# Patient Record
Sex: Male | Born: 1965
Health system: Southern US, Community
[De-identification: ages and names within clinical notes are randomized; demographics above are authoritative.]

## PROBLEM LIST (undated history)

## (undated) ENCOUNTER — Ambulatory Visit: Admission: EM | Payer: BC Managed Care – PPO | Source: Home / Self Care

## (undated) ENCOUNTER — Ambulatory Visit: Admission: EM | Payer: BC Managed Care – PPO

## (undated) DIAGNOSIS — J45909 Unspecified asthma, uncomplicated: Secondary | ICD-10-CM

## (undated) DIAGNOSIS — W3400XA Accidental discharge from unspecified firearms or gun, initial encounter: Secondary | ICD-10-CM

## (undated) DIAGNOSIS — E785 Hyperlipidemia, unspecified: Secondary | ICD-10-CM

## (undated) DIAGNOSIS — I10 Essential (primary) hypertension: Secondary | ICD-10-CM

## (undated) DIAGNOSIS — Z720 Tobacco use: Secondary | ICD-10-CM

## (undated) DIAGNOSIS — F1911 Other psychoactive substance abuse, in remission: Secondary | ICD-10-CM

## (undated) HISTORY — DX: Tobacco use: Z72.0

## (undated) HISTORY — DX: Unspecified asthma, uncomplicated: J45.909

## (undated) HISTORY — DX: Other psychoactive substance abuse, in remission: F19.11

## (undated) HISTORY — DX: Hyperlipidemia, unspecified: E78.5

## (undated) HISTORY — PX: ORCHIECTOMY: SHX2116

---

## 2000-03-13 ENCOUNTER — Encounter: Payer: Self-pay | Admitting: Emergency Medicine

## 2000-03-13 ENCOUNTER — Inpatient Hospital Stay (HOSPITAL_COMMUNITY): Admission: EM | Admit: 2000-03-13 | Discharge: 2000-03-15 | Payer: Self-pay

## 2000-03-16 ENCOUNTER — Emergency Department (HOSPITAL_COMMUNITY): Admission: EM | Admit: 2000-03-16 | Discharge: 2000-03-16 | Payer: Self-pay | Admitting: Emergency Medicine

## 2001-04-16 ENCOUNTER — Emergency Department (HOSPITAL_COMMUNITY): Admission: EM | Admit: 2001-04-16 | Discharge: 2001-04-16 | Payer: Self-pay | Admitting: Emergency Medicine

## 2001-04-16 ENCOUNTER — Encounter: Payer: Self-pay | Admitting: Emergency Medicine

## 2013-07-06 ENCOUNTER — Encounter (HOSPITAL_COMMUNITY): Payer: Self-pay | Admitting: Emergency Medicine

## 2013-07-06 ENCOUNTER — Emergency Department (HOSPITAL_COMMUNITY)
Admission: EM | Admit: 2013-07-06 | Discharge: 2013-07-06 | Disposition: A | Discharge status: Home / Self Care | Payer: Self-pay | Attending: Emergency Medicine | Admitting: Emergency Medicine

## 2013-07-06 DIAGNOSIS — K648 Other hemorrhoids: Secondary | ICD-10-CM | POA: Insufficient documentation

## 2013-07-06 DIAGNOSIS — Z79899 Other long term (current) drug therapy: Secondary | ICD-10-CM | POA: Insufficient documentation

## 2013-07-06 DIAGNOSIS — I10 Essential (primary) hypertension: Secondary | ICD-10-CM | POA: Insufficient documentation

## 2013-07-06 DIAGNOSIS — K921 Melena: Secondary | ICD-10-CM

## 2013-07-06 DIAGNOSIS — N400 Enlarged prostate without lower urinary tract symptoms: Secondary | ICD-10-CM | POA: Insufficient documentation

## 2013-07-06 DIAGNOSIS — K625 Hemorrhage of anus and rectum: Secondary | ICD-10-CM | POA: Insufficient documentation

## 2013-07-06 DIAGNOSIS — R319 Hematuria, unspecified: Secondary | ICD-10-CM

## 2013-07-06 DIAGNOSIS — K644 Residual hemorrhoidal skin tags: Secondary | ICD-10-CM | POA: Insufficient documentation

## 2013-07-06 DIAGNOSIS — F17200 Nicotine dependence, unspecified, uncomplicated: Secondary | ICD-10-CM | POA: Insufficient documentation

## 2013-07-06 HISTORY — DX: Essential (primary) hypertension: I10

## 2013-07-06 LAB — URINE MICROSCOPIC-ADD ON

## 2013-07-06 LAB — CBC WITH DIFFERENTIAL/PLATELET
Basophils Relative: 0 % (ref 0–1)
Eosinophils Absolute: 0.1 10*3/uL (ref 0.0–0.7)
Hemoglobin: 16.6 g/dL (ref 13.0–17.0)
Lymphocytes Relative: 33 % (ref 12–46)
Lymphs Abs: 1.7 10*3/uL (ref 0.7–4.0)
MCH: 31.7 pg (ref 26.0–34.0)
MCHC: 35.8 g/dL (ref 30.0–36.0)
MCV: 88.5 fL (ref 78.0–100.0)
Monocytes Relative: 11 % (ref 3–12)
Neutro Abs: 2.7 10*3/uL (ref 1.7–7.7)
Neutrophils Relative %: 53 % (ref 43–77)
Platelets: 304 10*3/uL (ref 150–400)
RBC: 5.24 MIL/uL (ref 4.22–5.81)
RDW: 14 % (ref 11.5–15.5)
WBC: 5 10*3/uL (ref 4.0–10.5)

## 2013-07-06 LAB — URINALYSIS, ROUTINE W REFLEX MICROSCOPIC
Glucose, UA: NEGATIVE mg/dL
Hgb urine dipstick: NEGATIVE
Ketones, ur: 40 mg/dL — AB
Nitrite: NEGATIVE
Specific Gravity, Urine: 1.027 (ref 1.005–1.030)
Urobilinogen, UA: 1 mg/dL (ref 0.0–1.0)
pH: 6 (ref 5.0–8.0)

## 2013-07-06 LAB — BASIC METABOLIC PANEL
BUN: 10 mg/dL (ref 6–23)
CO2: 26 mEq/L (ref 19–32)
Calcium: 9.5 mg/dL (ref 8.4–10.5)
Chloride: 101 mEq/L (ref 96–112)
Creatinine, Ser: 0.8 mg/dL (ref 0.50–1.35)
GFR calc Af Amer: >90 mL/min (ref >90)
GFR calc non Af Amer: >90 mL/min (ref >90)
Glucose, Bld: 102 mg/dL — ABNORMAL HIGH (ref 70–99)
Potassium: 4 mEq/L (ref 3.5–5.1)
Sodium: 138 mEq/L (ref 135–145)

## 2013-07-06 LAB — OCCULT BLOOD, POC DEVICE: Fecal Occult Bld: POSITIVE — AB

## 2013-07-06 MED ORDER — DOCUSATE SODIUM 100 MG PO CAPS: 100.0000 mg | ORAL_CAPSULE | Freq: Two times a day (BID) | ORAL | Status: DC

## 2013-07-06 NOTE — ED Notes (Signed)
Pt reports that he went for a drug screen for his job a month ago and was told he had blood in his urine. After that pt reports he started noticing bright red blood in his stools. Pt reports it has happened 15 times in the past month. Denies painful urination or BM's. Denies h/o hemorrhoids. Pt not on any blood thinners.

## 2013-07-06 NOTE — ED Notes (Signed)
MD at bedside. 

## 2013-07-06 NOTE — ED Provider Notes (Signed)
CSN: [id number]     Arrival date & time 07/06/13  4098 History   First MD Initiated Contact with Patient 07/06/13 [phone]     Chief Complaint  Patient presents with  . Hematuria  . Rectal Bleeding   (Consider location/radiation/quality/duration/timing/severity/associated sxs/prior Treatment) HPI Comments: 47 yo male presents to "get checked out" for cc of blood in urine which was found on UA at work.  Pt is without symptoms to include abd pain, f/c, n/v/d, or constipation, f/u/d.  He also reports occasional blood on toilet paper when wiping after a BM.  No blood in stool or commode.  He denies h/o hemorrhoids.  He denies CP, sob, lightheadedness.  Pt is pain free currently and denies pain associated with symptoms.    A: nkda M: none (noncompliant on BP) PMH: HTN PSH: none SHx: pos smoking, EtOH (3 drinks/day), no IDU  Patient is a 47 y.o. male presenting with hematuria and hematochezia. The history is provided by the patient. No language interpreter was used.  Hematuria This is a new problem. The current episode started more than 1 week ago. The problem occurs constantly. The problem has not changed since onset.Pertinent negatives include no chest pain, no abdominal pain, no headaches and no shortness of breath. Associated symptoms comments: No f/u/d, no abd pain, no n/v/d. He has tried nothing for the symptoms.  Rectal Bleeding Quality:  Bright red Amount:  Scant Duration:  1 month Timing:  Intermittent Chronicity:  New Context: not constipation, not diarrhea, not foreign body, not rectal injury and not rectal pain   Similar prior episodes: no   Relieved by:  None tried Associated symptoms: no abdominal pain, no dizziness, no epistaxis, no hematemesis, no light-headedness and no vomiting   Risk factors: no anticoagulant use, no hx of colorectal cancer, no hx of colorectal surgery, no hx of IBD, no liver disease, no NSAID use and no steroid use     Past Medical History  Diagnosis  Date  . Hypertension    History reviewed. No pertinent past surgical history. Family History  Problem Relation Age of Onset  . Diabetes Mother   . Hypertension Mother   . Diabetes Father    History  Substance Use Topics  . Smoking status: Current Every Day Smoker    Types: Cigars  . Smokeless tobacco: Never Used  . Alcohol Use: Yes     Comment: twice a week    Review of Systems  Constitutional: Negative.   HENT: Negative.  Negative for congestion, dental problem, drooling and nosebleeds.   Eyes: Negative.   Respiratory: Negative.  Negative for shortness of breath.   Cardiovascular: Negative for chest pain.  Gastrointestinal: Positive for blood in stool and hematochezia. Negative for nausea, vomiting, abdominal pain, diarrhea, constipation, abdominal distention, anal bleeding, rectal pain and hematemesis.  Endocrine: Negative.   Genitourinary: Positive for hematuria. Negative for dysuria, urgency, frequency, flank pain, decreased urine volume, discharge, penile swelling, scrotal swelling, enuresis, difficulty urinating, genital sores, penile pain and testicular pain.  Musculoskeletal: Negative.   Skin: Negative.   Neurological: Negative.  Negative for dizziness, light-headedness and headaches.  Hematological: Negative.     Allergies  Review of patient's allergies indicates no known allergies.  Home Medications   Current Outpatient Rx  Name  Route  Sig  Dispense  Refill  . docusate sodium (COLACE) 100 MG capsule   Oral   Take 1 capsule (100 mg total) by mouth every 12 (twelve) hours.   60 capsule  0    BP 148/90  Pulse 72  Temp(Src) 98 F (36.7 C) (Oral)  Resp 18  Ht 5\' 6"  (1.676 m)  Wt 150 lb (68.04 kg)  BMI 24.22 kg/m2  SpO2 98% Physical Exam  Constitutional: He is oriented to person, place, and time. He appears well-developed and well-nourished.  HENT:  Head: Normocephalic and atraumatic.  Eyes: Conjunctivae are normal. Right eye exhibits no discharge.  Left eye exhibits no discharge.  Neck: Normal range of motion. Neck supple.  Cardiovascular: Normal rate and regular rhythm.   Pulmonary/Chest: Effort normal and breath sounds normal. No respiratory distress. He has no wheezes. He has no rales.  Abdominal: Soft. Bowel sounds are normal. He exhibits no distension and no mass. There is no tenderness. There is no rebound and no guarding. Hernia confirmed negative in the left inguinal area.  Genitourinary: Rectal exam shows external hemorrhoid and internal hemorrhoid. Rectal exam shows no fissure, no mass, no tenderness and anal tone normal. Guaiac positive stool.    Prostate is enlarged. Prostate is not tender. Left testis shows no mass, no swelling and no tenderness. Left testis is descended. Cremasteric reflex is not absent on the left side. Circumcised. No phimosis, paraphimosis, hypospadias, penile erythema or penile tenderness. No discharge found.  No gross blood on DRE  Musculoskeletal: He exhibits no edema and no tenderness.  Neurological: He is alert and oriented to person, place, and time. He has normal reflexes.  Skin: Skin is warm and dry.    ED Course  Procedures (including critical care time) Labs Review Labs Reviewed  BASIC METABOLIC PANEL - Abnormal; Notable for the following:    Glucose, Bld 102 (*)    All other components within normal limits  URINALYSIS, ROUTINE W REFLEX MICROSCOPIC - Abnormal; Notable for the following:    Color, Urine AMBER (*)    Bilirubin Urine SMALL (*)    Ketones, ur 40 (*)    Leukocytes, UA TRACE (*)    All other components within normal limits  OCCULT BLOOD, POC DEVICE - Abnormal; Notable for the following:    Fecal Occult Bld POSITIVE (*)    All other components within normal limits  CBC WITH DIFFERENTIAL  URINE MICROSCOPIC-ADD ON   Imaging Review No results found.  EKG Interpretation   None      Results for orders placed during the hospital encounter of 07/06/13  CBC WITH  DIFFERENTIAL      Result Value Range   WBC 5.0  4.0 - 10.5 K/uL   RBC 5.24  4.22 - 5.81 MIL/uL   Hemoglobin 16.6  13.0 - 17.0 g/dL   HCT 65.7  84.6 - 96.2 %   MCV 88.5  78.0 - 100.0 fL   MCH 31.7  26.0 - 34.0 pg   MCHC 35.8  30.0 - 36.0 g/dL   RDW 95.2  84.1 - 32.4 %   Platelets 304  150 - 400 K/uL   Neutrophils Relative % 53  43 - 77 %   Neutro Abs 2.7  1.7 - 7.7 K/uL   Lymphocytes Relative 33  12 - 46 %   Lymphs Abs 1.7  0.7 - 4.0 K/uL   Monocytes Relative 11  3 - 12 %   Monocytes Absolute 0.5  0.1 - 1.0 K/uL   Eosinophils Relative 2  0 - 5 %   Eosinophils Absolute 0.1  0.0 - 0.7 K/uL   Basophils Relative 0  0 - 1 %   Basophils Absolute 0.0  0.0 - 0.1 K/uL  BASIC METABOLIC PANEL      Result Value Range   Sodium 138  135 - 145 mEq/L   Potassium 4.0  3.5 - 5.1 mEq/L   Chloride 101  96 - 112 mEq/L   CO2 26  19 - 32 mEq/L   Glucose, Bld 102 (*) 70 - 99 mg/dL   BUN 10  6 - 23 mg/dL   Creatinine, Ser 6.57  0.50 - 1.35 mg/dL   Calcium 9.5  8.4 - 84.6 mg/dL   GFR calc non Af Amer >90  >90 mL/min   GFR calc Af Amer >90  >90 mL/min  URINALYSIS, ROUTINE W REFLEX MICROSCOPIC      Result Value Range   Color, Urine AMBER (*) YELLOW   APPearance CLEAR  CLEAR   Specific Gravity, Urine 1.027  1.005 - 1.030   pH 6.0  5.0 - 8.0   Glucose, UA NEGATIVE  NEGATIVE mg/dL   Hgb urine dipstick NEGATIVE  NEGATIVE   Bilirubin Urine SMALL (*) NEGATIVE   Ketones, ur 40 (*) NEGATIVE mg/dL   Protein, ur NEGATIVE  NEGATIVE mg/dL   Urobilinogen, UA 1.0  0.0 - 1.0 mg/dL   Nitrite NEGATIVE  NEGATIVE   Leukocytes, UA TRACE (*) NEGATIVE  URINE MICROSCOPIC-ADD ON      Result Value Range   Squamous Epithelial / LPF RARE  RARE   WBC, UA 3-6  <3 WBC/hpf   RBC / HPF 3-6  <3 RBC/hpf   Bacteria, UA RARE  RARE  OCCULT BLOOD, POC DEVICE      Result Value Range   Fecal Occult Bld POSITIVE (*) NEGATIVE    MDM   1. Hematuria   2. Blood in the stool   3.  Smoking Cessation Counseling.    47 year old  male presents emergency broach chief complaint of reported hematuria from a recent urinalysis and blood on toilet paper when wiping. Patient has no abdominal pain. His vital signs are stable. He has a benign abdominal and GU exam. No gross blood on rectal.  Labs negative.  No hematuria on UA.  H/H normal.    Pt is to follow up with PCP for outpatient evaluation.  He may benefit from a colonoscopy.  Smoking cessation counseling given. Pt agrees with plan.   The patient appears reasonably screened and/or stabilized for discharge and I doubt any other medical condition or other [facility] requiring further screening, evaluation, or treatment in the ED at this time prior to discharge.    Darlys Gales, MD 07/06/13 2000

## 2013-08-24 ENCOUNTER — Emergency Department (HOSPITAL_COMMUNITY): Payer: Self-pay

## 2013-08-24 ENCOUNTER — Emergency Department (HOSPITAL_COMMUNITY)
Admission: EM | Admit: 2013-08-24 | Discharge: 2013-08-24 | Disposition: A | Discharge status: Home / Self Care | Payer: Self-pay | Attending: Emergency Medicine | Admitting: Emergency Medicine

## 2013-08-24 ENCOUNTER — Encounter (HOSPITAL_COMMUNITY): Payer: Self-pay | Admitting: Emergency Medicine

## 2013-08-24 DIAGNOSIS — M25519 Pain in unspecified shoulder: Secondary | ICD-10-CM | POA: Insufficient documentation

## 2013-08-24 DIAGNOSIS — Y929 Unspecified place or not applicable: Secondary | ICD-10-CM | POA: Insufficient documentation

## 2013-08-24 DIAGNOSIS — Z79899 Other long term (current) drug therapy: Secondary | ICD-10-CM | POA: Insufficient documentation

## 2013-08-24 DIAGNOSIS — S161XXA Strain of muscle, fascia and tendon at neck level, initial encounter: Secondary | ICD-10-CM

## 2013-08-24 DIAGNOSIS — S139XXA Sprain of joints and ligaments of unspecified parts of neck, initial encounter: Secondary | ICD-10-CM | POA: Insufficient documentation

## 2013-08-24 DIAGNOSIS — I10 Essential (primary) hypertension: Secondary | ICD-10-CM | POA: Insufficient documentation

## 2013-08-24 DIAGNOSIS — Y990 Civilian activity done for income or pay: Secondary | ICD-10-CM | POA: Insufficient documentation

## 2013-08-24 DIAGNOSIS — X500XXA Overexertion from strenuous movement or load, initial encounter: Secondary | ICD-10-CM | POA: Insufficient documentation

## 2013-08-24 DIAGNOSIS — F17200 Nicotine dependence, unspecified, uncomplicated: Secondary | ICD-10-CM | POA: Insufficient documentation

## 2013-08-24 MED ORDER — NAPROXEN 500 MG PO TABS: 500.0000 mg | ORAL_TABLET | Freq: Two times a day (BID) | ORAL | Status: DC

## 2013-08-24 MED ORDER — METHOCARBAMOL 500 MG PO TABS: 500.0000 mg | ORAL_TABLET | Freq: Two times a day (BID) | ORAL | Status: DC

## 2013-08-24 NOTE — ED Notes (Signed)
Pt back from x-ray.

## 2013-08-24 NOTE — ED Provider Notes (Signed)
Medical screening examination/treatment/procedure(s) were performed by non-physician practitioner and as supervising physician I was immediately available for consultation/collaboration.  EKG Interpretation   None         Lavonta Tillis Y. Shane Melby, MD 08/24/13 1555 

## 2013-08-24 NOTE — ED Notes (Signed)
Pt. Stated, I was moving something on Friday night and I felt something pop in my neck and it goes down my shoulder.

## 2013-08-24 NOTE — ED Provider Notes (Signed)
CSN: [id number]     Arrival date & time 08/24/13  1131 History   This chart was scribed for non-physician practitioner Jaynie Crumble, PA-C, working with Gavin Pound. Oletta Lamas, MD, by Yevette Edwards, ED Scribe. This patient was seen in room TR10C/TR10C and the patient's care was started at 1:03 PM.  First MD Initiated Contact with Patient 08/24/13 1248     No chief complaint on file.   The history is provided by the patient. No language interpreter was used.   HPI Comments: Mark Mendez is a 47 y.o. male who presents to the Emergency Department complaining of neck pain which began three days ago when he was moving a couch up the stairs at work and heard a "pop."  He denies pain radiating down into his hand, though he experiences pain to his right shoulder when he lies down. The pt has treated his pain with tylenol. The pt denies a h/o neck pain. He is a daily smoker.   Past Medical History  Diagnosis Date  . Hypertension    History reviewed. No pertinent past surgical history. Family History  Problem Relation Age of Onset  . Diabetes Mother   . Hypertension Mother   . Diabetes Father    History  Substance Use Topics  . Smoking status: Current Every Day Smoker    Types: Cigars  . Smokeless tobacco: Never Used  . Alcohol Use: Yes     Comment: twice a week    Review of Systems  Musculoskeletal: Positive for neck pain.  All other systems reviewed and are negative.    Allergies  Review of patient's allergies indicates no known allergies.  Home Medications   Current Outpatient Rx  Name  Route  Sig  Dispense  Refill  . docusate sodium (COLACE) 100 MG capsule   Oral   Take 1 capsule (100 mg total) by mouth every 12 (twelve) hours.   60 capsule   0    Triage Vitals: BP 147/92  Pulse 67  Temp(Src) 98.6 F (37 C) (Oral)  Resp 19  Wt 160 lb (72.576 kg)  SpO2 100%  Physical Exam  Nursing note and vitals reviewed. Constitutional: He is oriented to person, place, and  time. He appears well-developed and well-nourished. No distress.  HENT:  Head: Normocephalic and atraumatic.  Eyes: EOM are normal.  Neck: Neck supple. No tracheal deviation present.  Cardiovascular: Normal rate.   Pulmonary/Chest: Effort normal. No respiratory distress.  Musculoskeletal: Normal range of motion. He exhibits tenderness.  No tenderness to midline cervical. Tender over the right trapezius.  Tender to sternocleidomastoid.  Pain with flexion to left of head.   Neurological: He is alert and oriented to person, place, and time.  Normal sensation. 5/5 equal strength and grip.   Skin: Skin is warm and dry.  Psychiatric: He has a normal mood and affect. His behavior is normal.    ED Course  Procedures (including critical care time)  DIAGNOSTIC STUDIES: Oxygen Saturation is 100% on room air, normal by my interpretation.    COORDINATION OF CARE:  1:08 PM- Discussed treatment plan with patient, and the patient agreed to the plan.   Labs Review Labs Reviewed - No data to display Imaging Review Dg Cervical Spine Complete  08/24/2013   CLINICAL DATA:  Pain.  EXAM: CERVICAL SPINE  4+ VIEWS  COMPARISON:  None.  FINDINGS: Diffuse mild degenerative change. No evidence of fracture. No dislocation. Lung apices are clear.  IMPRESSION: Diffuse mild degenerative change.  No acute abnormality   Electronically Signed   By: Maisie Fus  Register   On: 08/24/2013 12:21    EKG Interpretation   None       MDM   1. Cervical strain, initial encounter    Patient with neck pain after moving some furniture 3 days ago. He is neurovascularly intact. His x-rays showed diffuse degenerative changes. His pain is mainly in his right sternocleidomastoid and trapezius muscle. I suspect this is muscular strain. His strength is intact. He is able to move all extremities. Will start him on anti-inflammatories, muscle relaxant, followup with primary care Dr.  Ceasar Mons Vitals:   08/24/13 1137  BP: 147/92   Pulse: 67  Temp: 98.6 F (37 C)  TempSrc: Oral  Resp: 19  Weight: 160 lb (72.576 kg)  SpO2: 100%     I personally performed the services described in this documentation, which was scribed in my presence. The recorded information has been reviewed and is accurate.    Lottie Mussel, PA-C 08/24/13 1537

## 2014-12-15 ENCOUNTER — Emergency Department (HOSPITAL_COMMUNITY): Payer: BLUE CROSS/BLUE SHIELD

## 2014-12-15 ENCOUNTER — Emergency Department (HOSPITAL_COMMUNITY)
Admission: EM | Admit: 2014-12-15 | Discharge: 2014-12-15 | Disposition: A | Discharge status: Home / Self Care | Payer: BLUE CROSS/BLUE SHIELD | Attending: Emergency Medicine | Admitting: Emergency Medicine

## 2014-12-15 ENCOUNTER — Encounter (HOSPITAL_COMMUNITY): Payer: Self-pay | Admitting: Family Medicine

## 2014-12-15 DIAGNOSIS — K029 Dental caries, unspecified: Secondary | ICD-10-CM | POA: Diagnosis not present

## 2014-12-15 DIAGNOSIS — J209 Acute bronchitis, unspecified: Secondary | ICD-10-CM | POA: Insufficient documentation

## 2014-12-15 DIAGNOSIS — Z791 Long term (current) use of non-steroidal anti-inflammatories (NSAID): Secondary | ICD-10-CM | POA: Insufficient documentation

## 2014-12-15 DIAGNOSIS — Z79899 Other long term (current) drug therapy: Secondary | ICD-10-CM | POA: Insufficient documentation

## 2014-12-15 DIAGNOSIS — K047 Periapical abscess without sinus: Secondary | ICD-10-CM | POA: Insufficient documentation

## 2014-12-15 DIAGNOSIS — J40 Bronchitis, not specified as acute or chronic: Secondary | ICD-10-CM

## 2014-12-15 DIAGNOSIS — Z720 Tobacco use: Secondary | ICD-10-CM | POA: Diagnosis not present

## 2014-12-15 DIAGNOSIS — R51 Headache: Secondary | ICD-10-CM | POA: Insufficient documentation

## 2014-12-15 DIAGNOSIS — I10 Essential (primary) hypertension: Secondary | ICD-10-CM | POA: Insufficient documentation

## 2014-12-15 DIAGNOSIS — K088 Other specified disorders of teeth and supporting structures: Secondary | ICD-10-CM | POA: Diagnosis not present

## 2014-12-15 DIAGNOSIS — K006 Disturbances in tooth eruption: Secondary | ICD-10-CM | POA: Insufficient documentation

## 2014-12-15 DIAGNOSIS — R05 Cough: Secondary | ICD-10-CM | POA: Diagnosis not present

## 2014-12-15 DIAGNOSIS — K0889 Other specified disorders of teeth and supporting structures: Secondary | ICD-10-CM

## 2014-12-15 DIAGNOSIS — R059 Cough, unspecified: Secondary | ICD-10-CM

## 2014-12-15 MED ORDER — GUAIFENESIN 100 MG/5ML PO LIQD
100.0000 mg | ORAL | Status: DC | PRN
Start: 2014-12-15 — End: 2016-09-17

## 2014-12-15 MED ORDER — AMOXICILLIN 500 MG PO CAPS: 500.0000 mg | ORAL_CAPSULE | Freq: Two times a day (BID) | ORAL | Status: DC

## 2014-12-15 MED ORDER — IPRATROPIUM-ALBUTEROL 0.5-2.5 (3) MG/3ML IN SOLN
3.0000 mL | Freq: Once | RESPIRATORY_TRACT | Status: AC
  Administered 2014-12-15: 3 mL via RESPIRATORY_TRACT
  Filled 2014-12-15: qty 3

## 2014-12-15 MED ORDER — HYDROCODONE-ACETAMINOPHEN 5-325 MG PO TABS: 1.0000 | ORAL_TABLET | Freq: Four times a day (QID) | ORAL | Status: DC | PRN

## 2014-12-15 MED ORDER — ALBUTEROL SULFATE HFA 108 (90 BASE) MCG/ACT IN AERS: 1.0000 | INHALATION_SPRAY | Freq: Four times a day (QID) | RESPIRATORY_TRACT | Status: DC | PRN

## 2014-12-15 MED ORDER — HYDROCODONE-ACETAMINOPHEN 5-325 MG PO TABS
2.0000 | ORAL_TABLET | Freq: Once | ORAL | Status: AC
  Administered 2014-12-15: 2 via ORAL
  Filled 2014-12-15: qty 2

## 2014-12-15 MED ORDER — AMOXICILLIN 500 MG PO CAPS
500.0000 mg | ORAL_CAPSULE | Freq: Once | ORAL | Status: AC
  Administered 2014-12-15: 500 mg via ORAL
  Filled 2014-12-15: qty 1

## 2014-12-15 NOTE — ED Notes (Signed)
Pt sts weeks of left sided headache along with gum and tooth pain. Sts also cough

## 2014-12-15 NOTE — ED Provider Notes (Signed)
CSN: [id number]     Arrival date & time 12/15/14  0808 History   First MD Initiated Contact with Patient 12/15/14 [phone]     Chief Complaint  Patient presents with  . Headache  . Dental Pain  . Cough     (Consider location/radiation/quality/duration/timing/severity/associated sxs/prior Treatment) The history is provided by the patient.  Mark Mendez is a 49 y.o. male hx of HTN here with left-sided headache, dental pain, cough. Patient has been having some dental pain for the last several days associated with some headaches. Also has some progressive cough with yellowish sputum. Denies any fevers but he has still been smoking. Denies any nausea vomiting or abdominal pain.    Past Medical History  Diagnosis Date  . Hypertension    History reviewed. No pertinent past surgical history. Family History  Problem Relation Age of Onset  . Diabetes Mother   . Hypertension Mother   . Diabetes Father    History  Substance Use Topics  . Smoking status: Current Every Day Smoker    Types: Cigars  . Smokeless tobacco: Never Used  . Alcohol Use: Yes     Comment: twice a week    Review of Systems  HENT: Positive for dental problem.   Respiratory: Positive for cough.   Neurological: Positive for headaches.  All other systems reviewed and are negative.     Allergies  Review of patient's allergies indicates no known allergies.  Home Medications   Prior to Admission medications   Medication Sig Start Date End Date Taking? Authorizing Provider  acetaminophen (TYLENOL) 325 MG tablet Take 325 mg by mouth every 6 (six) hours as needed for headache.   Yes Historical Provider, MD  docusate sodium (COLACE) 100 MG capsule Take 1 capsule (100 mg total) by mouth every 12 (twelve) hours. Patient not taking: Reported on 12/15/2014 07/06/13   Elmer Sow, MD  methocarbamol (ROBAXIN) 500 MG tablet Take 1 tablet (500 mg total) by mouth 2 (two) times daily. Patient not taking: Reported on 12/15/2014  08/24/13   Tatyana Kirichenko, PA-C  naproxen (NAPROSYN) 500 MG tablet Take 1 tablet (500 mg total) by mouth 2 (two) times daily. Patient not taking: Reported on 12/15/2014 08/24/13   Tatyana Kirichenko, PA-C   BP 148/99 mmHg  Pulse 89  Temp(Src) 97.9 F (36.6 C) (Oral)  Resp 20  SpO2 95% Physical Exam  Constitutional: He is oriented to person, place, and time.  Uncomfortable   HENT:  Head: Normocephalic.  Poor dentition with multiple cavities. Tenderness gums around L lower and upper molars but with no obvious abscess   Eyes: Conjunctivae are normal. Pupils are equal, round, and reactive to light.  Neck: Normal range of motion. Neck supple.  No cervical LAD   Cardiovascular: Normal rate, regular rhythm and normal heart sounds.   Pulmonary/Chest: Effort normal.  + wheezing, no retractions   Abdominal: Soft. Bowel sounds are normal. He exhibits no distension. There is no tenderness. There is no rebound and no guarding.  Musculoskeletal: Normal range of motion. He exhibits no edema or tenderness.  Neurological: He is alert and oriented to person, place, and time. No cranial nerve deficit. Coordination normal.  Skin: Skin is warm and dry.  Psychiatric: He has a normal mood and affect. His behavior is normal. Judgment and thought content normal.  Nursing note and vitals reviewed.   ED Course  Procedures (including critical care time) Labs Review Labs Reviewed - No data to display  Imaging Review Dg Chest  2 View  12/15/2014   CLINICAL DATA:  49 year old male with chest pain for 1 week. Cough. Initial encounter.  EXAM: CHEST  2 VIEW  COMPARISON:  None.  FINDINGS: Lung volumes at the upper limits of normal to hyperinflated. Normal cardiac size and mediastinal contours. Visualized tracheal air column is within normal limits. No pneumothorax, pulmonary edema, pleural effusion or confluent pulmonary opacity. No acute osseous abnormality identified.  IMPRESSION: No acute cardiopulmonary  abnormality.   Electronically Signed   By: Genevie Ann M.D.   On: 12/15/2014 08:35     EKG Interpretation None      MDM   Final diagnoses:  Cough   Mark Mendez is a 49 y.o. male here with cough, has dental infection. Likely bronchitis vs pneumonia. Will give nebs and get CXR. Will give abx for dental infection but there is no periapical abscess.   9:24 AM Less wheezing after neb. CXR showed no pneumonia. Will give amoxicillin for bronchitis and dental infection. Will refer to dentist.     Wandra Arthurs, MD 12/15/14 [phone]

## 2014-12-15 NOTE — Discharge Instructions (Signed)
Stay hydrated.   Take motrin for pain.   Take vicodin for severe pain.   Stop smoking.   Use albuterol every 4 hrs as needed for cough. Use robitussin for cough as well.   Call dental today to get appointment.   Finish amoxicillin as prescribed.   Return to ER if you have fever, trouble breathing, vomiting, worse dental pain.

## 2016-09-13 ENCOUNTER — Emergency Department (HOSPITAL_COMMUNITY): Payer: No Typology Code available for payment source

## 2016-09-13 ENCOUNTER — Emergency Department (HOSPITAL_COMMUNITY)
Admission: EM | Admit: 2016-09-13 | Discharge: 2016-09-13 | Disposition: A | Discharge status: Home / Self Care | Payer: No Typology Code available for payment source | Attending: Emergency Medicine | Admitting: Emergency Medicine

## 2016-09-13 ENCOUNTER — Encounter (HOSPITAL_COMMUNITY): Payer: Self-pay | Admitting: Neurology

## 2016-09-13 DIAGNOSIS — Y939 Activity, unspecified: Secondary | ICD-10-CM | POA: Insufficient documentation

## 2016-09-13 DIAGNOSIS — F17200 Nicotine dependence, unspecified, uncomplicated: Secondary | ICD-10-CM | POA: Diagnosis not present

## 2016-09-13 DIAGNOSIS — W3400XA Accidental discharge from unspecified firearms or gun, initial encounter: Secondary | ICD-10-CM

## 2016-09-13 DIAGNOSIS — R791 Abnormal coagulation profile: Secondary | ICD-10-CM | POA: Insufficient documentation

## 2016-09-13 DIAGNOSIS — Z23 Encounter for immunization: Secondary | ICD-10-CM | POA: Diagnosis not present

## 2016-09-13 DIAGNOSIS — S71101A Unspecified open wound, right thigh, initial encounter: Secondary | ICD-10-CM | POA: Insufficient documentation

## 2016-09-13 DIAGNOSIS — S71102A Unspecified open wound, left thigh, initial encounter: Secondary | ICD-10-CM | POA: Insufficient documentation

## 2016-09-13 DIAGNOSIS — Y929 Unspecified place or not applicable: Secondary | ICD-10-CM | POA: Insufficient documentation

## 2016-09-13 DIAGNOSIS — Y999 Unspecified external cause status: Secondary | ICD-10-CM | POA: Insufficient documentation

## 2016-09-13 LAB — CBC
HCT: 37.3 % — ABNORMAL LOW (ref 39.0–52.0)
Hemoglobin: 12 g/dL — ABNORMAL LOW (ref 13.0–17.0)
MCH: 26.3 pg (ref 26.0–34.0)
MCHC: 32.2 g/dL (ref 30.0–36.0)
MCV: 81.8 fL (ref 78.0–100.0)
PLATELETS: 394 10*3/uL (ref 150–400)
RBC: 4.56 MIL/uL (ref 4.22–5.81)
RDW: 19.4 % — AB (ref 11.5–15.5)
WBC: 9 10*3/uL (ref 4.0–10.5)

## 2016-09-13 LAB — PROTIME-INR
INR: 0.94
Prothrombin Time: 12.6 seconds (ref 11.4–15.2)

## 2016-09-13 LAB — I-STAT CHEM 8, ED
BUN: 9 mg/dL (ref 6–20)
CREATININE: 1.1 mg/dL (ref 0.61–1.24)
Calcium, Ion: 1.08 mmol/L — ABNORMAL LOW (ref 1.15–1.40)
Chloride: 105 mmol/L (ref 101–111)
Glucose, Bld: 92 mg/dL (ref 65–99)
HEMATOCRIT: 41 % (ref 39.0–52.0)
HEMOGLOBIN: 13.9 g/dL (ref 13.0–17.0)
POTASSIUM: 4.4 mmol/L (ref 3.5–5.1)
Sodium: 141 mmol/L (ref 135–145)
TCO2: 26 mmol/L (ref 0–100)

## 2016-09-13 LAB — PREPARE FRESH FROZEN PLASMA
BLOOD PRODUCT EXPIRATION DATE: 201801172359
Blood Product Expiration Date: 201801142359
ISSUE DATE / TIME: 201801111607
ISSUE DATE / TIME: 201801111607
UNIT TYPE AND RH: 6200
Unit Type and Rh: 6200

## 2016-09-13 LAB — I-STAT CG4 LACTIC ACID, ED: Lactic Acid, Venous: 3.07 mmol/L (ref 0.5–1.9)

## 2016-09-13 LAB — TYPE AND SCREEN
ABO/RH(D): O POS
Antibody Screen: NEGATIVE
Unit division: 0
Unit division: 0

## 2016-09-13 LAB — COMPREHENSIVE METABOLIC PANEL
ALT: 10 U/L — AB (ref 17–63)
AST: 34 U/L (ref 15–41)
Albumin: 4 g/dL (ref 3.5–5.0)
Alkaline Phosphatase: 48 U/L (ref 38–126)
Anion gap: 11 (ref 5–15)
BUN: 9 mg/dL (ref 6–20)
CHLORIDE: 104 mmol/L (ref 101–111)
CO2: 22 mmol/L (ref 22–32)
CREATININE: 0.97 mg/dL (ref 0.61–1.24)
Calcium: 9.1 mg/dL (ref 8.9–10.3)
GFR calc Af Amer: >60 mL/min (ref >60)
GLUCOSE: 93 mg/dL (ref 65–99)
Potassium: 4.5 mmol/L (ref 3.5–5.1)
Sodium: 137 mmol/L (ref 135–145)
Total Bilirubin: 0.2 mg/dL — ABNORMAL LOW (ref 0.3–1.2)
Total Protein: 7.7 g/dL (ref 6.5–8.1)

## 2016-09-13 LAB — ETHANOL: Alcohol, Ethyl (B): 168 mg/dL — ABNORMAL HIGH (ref <5)

## 2016-09-13 LAB — ABO/RH: ABO/RH(D): O POS

## 2016-09-13 LAB — CDS SEROLOGY

## 2016-09-13 MED ORDER — HYDROCODONE-ACETAMINOPHEN 5-325 MG PO TABS: 1.0000 | ORAL_TABLET | Freq: Four times a day (QID) | ORAL | 0 refills | Status: DC | PRN

## 2016-09-13 MED ORDER — HYDROMORPHONE HCL 2 MG/ML IJ SOLN
1.0000 mg | Freq: Once | INTRAMUSCULAR | Status: AC
  Administered 2016-09-13: 1 mg via INTRAVENOUS
  Filled 2016-09-13: qty 1

## 2016-09-13 MED ORDER — FENTANYL CITRATE (PF) 100 MCG/2ML IJ SOLN
50.0000 ug | Freq: Once | INTRAMUSCULAR | Status: AC
  Administered 2016-09-13: 50 ug via INTRAVENOUS

## 2016-09-13 MED ORDER — CYCLOBENZAPRINE HCL 10 MG PO TABS: 10.0000 mg | ORAL_TABLET | Freq: Three times a day (TID) | ORAL | 0 refills | Status: DC | PRN

## 2016-09-13 MED ORDER — TETANUS-DIPHTH-ACELL PERTUSSIS 5-2.5-18.5 LF-MCG/0.5 IM SUSP
0.5000 mL | Freq: Once | INTRAMUSCULAR | Status: AC
  Administered 2016-09-13: 0.5 mL via INTRAMUSCULAR
  Filled 2016-09-13: qty 0.5

## 2016-09-13 MED ORDER — FENTANYL CITRATE (PF) 100 MCG/2ML IJ SOLN
INTRAMUSCULAR | Status: AC
  Filled 2016-09-13: qty 2

## 2016-09-13 NOTE — ED Notes (Signed)
Pt's mother at bedside.
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
## 2016-09-13 NOTE — ED Notes (Signed)
Report taken from Butler Denmark, RN

## 2016-09-13 NOTE — ED Notes (Signed)
IVs removed by Dorothea Ogle, nurse tech

## 2016-09-13 NOTE — Progress Notes (Signed)
Orthopedic Tech Progress Note Patient Details:  Mark Mendez [birth year] [id number]  Patient ID: [name], male   DOB: 1966-08-02, 51 y.o.   MRN: [id number]   [name] 09/13/2016, 4:29 PM Made level; 2 trauma visit

## 2016-09-13 NOTE — ED Notes (Signed)
CSI at bedside with patient.
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
## 2016-09-14 ENCOUNTER — Encounter (HOSPITAL_COMMUNITY): Payer: Self-pay | Admitting: Family Medicine

## 2016-09-17 ENCOUNTER — Emergency Department (HOSPITAL_COMMUNITY): Payer: No Typology Code available for payment source

## 2016-09-17 ENCOUNTER — Encounter (HOSPITAL_COMMUNITY): Payer: Self-pay | Admitting: Radiology

## 2016-09-17 ENCOUNTER — Emergency Department (HOSPITAL_COMMUNITY)
Admission: EM | Admit: 2016-09-17 | Discharge: 2016-09-17 | Disposition: A | Discharge status: Home / Self Care | Payer: No Typology Code available for payment source | Attending: Emergency Medicine | Admitting: Emergency Medicine

## 2016-09-17 DIAGNOSIS — W3409XS Accidental discharge from other specified firearms, sequela: Secondary | ICD-10-CM | POA: Insufficient documentation

## 2016-09-17 DIAGNOSIS — L0390 Cellulitis, unspecified: Secondary | ICD-10-CM | POA: Diagnosis not present

## 2016-09-17 DIAGNOSIS — I10 Essential (primary) hypertension: Secondary | ICD-10-CM | POA: Diagnosis not present

## 2016-09-17 DIAGNOSIS — F17290 Nicotine dependence, other tobacco product, uncomplicated: Secondary | ICD-10-CM | POA: Diagnosis not present

## 2016-09-17 DIAGNOSIS — W3400XS Accidental discharge from unspecified firearms or gun, sequela: Secondary | ICD-10-CM

## 2016-09-17 DIAGNOSIS — M79659 Pain in unspecified thigh: Secondary | ICD-10-CM

## 2016-09-17 DIAGNOSIS — M79652 Pain in left thigh: Secondary | ICD-10-CM | POA: Diagnosis present

## 2016-09-17 DIAGNOSIS — S71102S Unspecified open wound, left thigh, sequela: Secondary | ICD-10-CM | POA: Diagnosis not present

## 2016-09-17 DIAGNOSIS — S71132S Puncture wound without foreign body, left thigh, sequela: Secondary | ICD-10-CM

## 2016-09-17 LAB — BASIC METABOLIC PANEL
Anion gap: 9 (ref 5–15)
BUN: 11 mg/dL (ref 6–20)
CHLORIDE: 101 mmol/L (ref 101–111)
CO2: 24 mmol/L (ref 22–32)
Calcium: 9.1 mg/dL (ref 8.9–10.3)
Creatinine, Ser: 0.86 mg/dL (ref 0.61–1.24)
GFR calc non Af Amer: >60 mL/min (ref >60)
Glucose, Bld: 82 mg/dL (ref 65–99)
Potassium: 4.5 mmol/L (ref 3.5–5.1)
SODIUM: 134 mmol/L — AB (ref 135–145)

## 2016-09-17 LAB — CBC WITH DIFFERENTIAL/PLATELET
BASOS ABS: 0 10*3/uL (ref 0.0–0.1)
BASOS PCT: 0 %
Eosinophils Absolute: 0.2 10*3/uL (ref 0.0–0.7)
Eosinophils Relative: 2 %
HCT: 34.7 % — ABNORMAL LOW (ref 39.0–52.0)
HEMOGLOBIN: 11.1 g/dL — AB (ref 13.0–17.0)
Lymphocytes Relative: 17 %
Lymphs Abs: 1.6 10*3/uL (ref 0.7–4.0)
MCH: 26.1 pg (ref 26.0–34.0)
MCHC: 32 g/dL (ref 30.0–36.0)
MCV: 81.6 fL (ref 78.0–100.0)
Monocytes Absolute: 0.8 10*3/uL (ref 0.1–1.0)
Monocytes Relative: 9 %
NEUTROS PCT: 72 %
Neutro Abs: 7 10*3/uL (ref 1.7–7.7)
Platelets: 409 10*3/uL — ABNORMAL HIGH (ref 150–400)
RBC: 4.25 MIL/uL (ref 4.22–5.81)
RDW: 18.5 % — ABNORMAL HIGH (ref 11.5–15.5)
WBC: 9.7 10*3/uL (ref 4.0–10.5)

## 2016-09-17 MED ORDER — HYDROMORPHONE HCL 2 MG/ML IJ SOLN
1.0000 mg | Freq: Once | INTRAMUSCULAR | Status: AC
  Administered 2016-09-17: 1 mg via INTRAVENOUS
  Filled 2016-09-17: qty 1

## 2016-09-17 MED ORDER — MORPHINE SULFATE (PF) 4 MG/ML IV SOLN
4.0000 mg | Freq: Once | INTRAVENOUS | Status: AC
  Administered 2016-09-17: 4 mg via INTRAVENOUS
  Filled 2016-09-17: qty 1

## 2016-09-17 MED ORDER — IOPAMIDOL (ISOVUE-300) INJECTION 61%
INTRAVENOUS | Status: AC
  Administered 2016-09-17: 75 mL
  Filled 2016-09-17: qty 75

## 2016-09-17 MED ORDER — CEPHALEXIN 500 MG PO CAPS: 500.0000 mg | ORAL_CAPSULE | Freq: Three times a day (TID) | ORAL | 0 refills | Status: DC
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
## 2016-09-18 ENCOUNTER — Emergency Department (HOSPITAL_COMMUNITY)
Admission: EM | Admit: 2016-09-18 | Discharge: 2016-09-18 | Disposition: A | Discharge status: Home / Self Care | Payer: No Typology Code available for payment source | Attending: Emergency Medicine | Admitting: Emergency Medicine

## 2016-09-18 ENCOUNTER — Encounter (HOSPITAL_COMMUNITY): Payer: Self-pay

## 2016-09-18 DIAGNOSIS — F17290 Nicotine dependence, other tobacco product, uncomplicated: Secondary | ICD-10-CM | POA: Insufficient documentation

## 2016-09-18 DIAGNOSIS — W3400XA Accidental discharge from unspecified firearms or gun, initial encounter: Secondary | ICD-10-CM

## 2016-09-18 DIAGNOSIS — Z79899 Other long term (current) drug therapy: Secondary | ICD-10-CM | POA: Insufficient documentation

## 2016-09-18 DIAGNOSIS — I10 Essential (primary) hypertension: Secondary | ICD-10-CM | POA: Diagnosis not present

## 2016-09-18 DIAGNOSIS — W3400XD Accidental discharge from unspecified firearms or gun, subsequent encounter: Secondary | ICD-10-CM | POA: Insufficient documentation

## 2016-09-18 DIAGNOSIS — S71102D Unspecified open wound, left thigh, subsequent encounter: Secondary | ICD-10-CM | POA: Diagnosis present

## 2016-09-18 HISTORY — DX: Accidental discharge from unspecified firearms or gun, initial encounter: W34.00XA

## 2016-09-18 MED ORDER — HYDROCODONE-ACETAMINOPHEN 5-325 MG PO TABS: 1.0000 | ORAL_TABLET | ORAL | 0 refills | Status: DC | PRN

## 2016-09-18 NOTE — ED Triage Notes (Signed)
Pt is requesting pain medication. Stated that he is not scheduled to be seen by doctor for one week
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
## 2016-09-18 NOTE — Discharge Instructions (Signed)
Continue wound care as directed.  Finish antibiotic.  Pain medication as needed.
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
## 2016-09-25 ENCOUNTER — Inpatient Hospital Stay: Payer: Self-pay | Admitting: Internal Medicine

## 2016-09-26 ENCOUNTER — Ambulatory Visit: Payer: No Typology Code available for payment source | Attending: Internal Medicine | Admitting: Physician Assistant

## 2016-09-26 VITALS — BP 126/82 | HR 96 | Temp 98.3°F | Resp 18 | Ht 67.0 in | Wt 146.4 lb

## 2016-09-26 DIAGNOSIS — L03116 Cellulitis of left lower limb: Secondary | ICD-10-CM | POA: Diagnosis not present

## 2016-09-26 DIAGNOSIS — Z79899 Other long term (current) drug therapy: Secondary | ICD-10-CM | POA: Diagnosis not present

## 2016-09-26 DIAGNOSIS — E871 Hypo-osmolality and hyponatremia: Secondary | ICD-10-CM | POA: Insufficient documentation

## 2016-09-26 DIAGNOSIS — I10 Essential (primary) hypertension: Secondary | ICD-10-CM | POA: Diagnosis not present

## 2016-09-26 DIAGNOSIS — S71132A Puncture wound without foreign body, left thigh, initial encounter: Secondary | ICD-10-CM | POA: Insufficient documentation

## 2016-09-26 DIAGNOSIS — S71102A Unspecified open wound, left thigh, initial encounter: Secondary | ICD-10-CM

## 2016-09-26 DIAGNOSIS — D649 Anemia, unspecified: Secondary | ICD-10-CM | POA: Diagnosis not present

## 2016-09-26 DIAGNOSIS — W3400XA Accidental discharge from unspecified firearms or gun, initial encounter: Secondary | ICD-10-CM

## 2016-09-26 MED ORDER — ACETAMINOPHEN-CODEINE #3 300-30 MG PO TABS: 1.0000 | ORAL_TABLET | ORAL | 0 refills | Status: DC | PRN

## 2016-09-26 MED ORDER — CEPHALEXIN 500 MG PO CAPS: 500.0000 mg | ORAL_CAPSULE | Freq: Three times a day (TID) | ORAL | 0 refills | Status: DC

## 2016-09-26 MED ORDER — NAPROXEN 500 MG PO TBEC: 500.0000 mg | DELAYED_RELEASE_TABLET | Freq: Two times a day (BID) | ORAL | 0 refills | Status: DC

## 2016-09-26 MED ORDER — CEPHALEXIN 500 MG PO CAPS: 500.0000 mg | ORAL_CAPSULE | Freq: Three times a day (TID) | ORAL | 0 refills | Status: AC

## 2016-09-26 MED FILL — ACETAMINOPHEN/COD #3 TABLET: 300-30 | 3 days supply | Qty: 20 | Fill #0

## 2016-09-26 MED FILL — CEPHALEXIN 500 MG CAPSULE: 500 | 10 days supply | Qty: 30 | Fill #0
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
## 2016-10-01 ENCOUNTER — Telehealth: Payer: Self-pay | Admitting: General Practice

## 2016-10-01 NOTE — Telephone Encounter (Signed)
Pt called requesting medication refill on acetaminophen-codeine (TYLENOL #3) 300-30 MG tablet pt has not established care yet. Is scheduled w/Dr Janne Napoleon 02/06 but is needing medication before then.

## 2016-10-09 ENCOUNTER — Ambulatory Visit: Payer: Self-pay | Admitting: Internal Medicine

## 2016-10-10 ENCOUNTER — Ambulatory Visit: Payer: No Typology Code available for payment source | Attending: Internal Medicine | Admitting: Internal Medicine

## 2016-10-10 ENCOUNTER — Other Ambulatory Visit: Payer: Self-pay | Admitting: Internal Medicine

## 2016-10-10 ENCOUNTER — Encounter: Payer: Self-pay | Admitting: Internal Medicine

## 2016-10-10 VITALS — BP 118/78 | HR 104 | Temp 97.9°F | Resp 16 | Wt 147.4 lb

## 2016-10-10 DIAGNOSIS — S71102A Unspecified open wound, left thigh, initial encounter: Secondary | ICD-10-CM

## 2016-10-10 DIAGNOSIS — I10 Essential (primary) hypertension: Secondary | ICD-10-CM | POA: Diagnosis not present

## 2016-10-10 DIAGNOSIS — S71132A Puncture wound without foreign body, left thigh, initial encounter: Secondary | ICD-10-CM | POA: Insufficient documentation

## 2016-10-10 DIAGNOSIS — Z79899 Other long term (current) drug therapy: Secondary | ICD-10-CM | POA: Diagnosis not present

## 2016-10-10 DIAGNOSIS — Z7689 Persons encountering health services in other specified circumstances: Secondary | ICD-10-CM | POA: Diagnosis not present

## 2016-10-10 DIAGNOSIS — Z720 Tobacco use: Secondary | ICD-10-CM | POA: Diagnosis not present

## 2016-10-10 DIAGNOSIS — M79605 Pain in left leg: Secondary | ICD-10-CM

## 2016-10-10 DIAGNOSIS — W3400XA Accidental discharge from unspecified firearms or gun, initial encounter: Secondary | ICD-10-CM

## 2016-10-10 MED ORDER — NAPROXEN 500 MG PO TBEC: 500.0000 mg | DELAYED_RELEASE_TABLET | Freq: Two times a day (BID) | ORAL | 0 refills | Status: DC

## 2016-10-10 MED ORDER — ACETAMINOPHEN-CODEINE #3 300-30 MG PO TABS: 1.0000 | ORAL_TABLET | ORAL | 0 refills | Status: DC | PRN

## 2016-10-10 MED ORDER — GABAPENTIN 300 MG PO CAPS: 300.0000 mg | ORAL_CAPSULE | Freq: Three times a day (TID) | ORAL | 3 refills | Status: DC

## 2016-10-10 MED FILL — ACETAMINOPHEN/COD #3 TABLET: 300-30 | 7 days supply | Qty: 45 | Fill #0

## 2016-10-10 NOTE — Patient Instructions (Addendum)
Financial aid packet -   - Health Maintenance, Male A healthy lifestyle and preventative care can promote health and wellness.  Maintain regular health, dental, and eye exams.  Eat a healthy diet. Foods like vegetables, fruits, whole grains, low-fat dairy products, and lean protein foods contain the nutrients you need and are low in calories. Decrease your intake of foods high in solid fats, added sugars, and salt. Get information about a proper diet from your health care provider, if necessary.  Regular physical exercise is one of the most important things you can do for your health. Most adults should get at least 150 minutes of moderate-intensity exercise (any activity that increases your heart rate and causes you to sweat) each week. In addition, most adults need muscle-strengthening exercises on 2 or more days a week.   Maintain a healthy weight. The body mass index (BMI) is a screening tool to identify possible weight problems. It provides an estimate of body fat based on height and weight. Your health care provider can find your BMI and can help you achieve or maintain a healthy weight. For males 20 years and older:  A BMI below 18.5 is considered underweight.  A BMI of 18.5 to 24.9 is normal.  A BMI of 25 to 29.9 is considered overweight.  A BMI of 30 and above is considered obese.  Maintain normal blood lipids and cholesterol by exercising and minimizing your intake of saturated fat. Eat a balanced diet with plenty of fruits and vegetables. Blood tests for lipids and cholesterol should begin at age 43 and be repeated every 5 years. If your lipid or cholesterol levels are high, you are over age 82, or you are at high risk for heart disease, you may need your cholesterol levels checked more frequently.Ongoing high lipid and cholesterol levels should be treated with medicines if diet and exercise are not working.  If you smoke, find out from your health care provider how to quit. If  you do not use tobacco, do not start.  Lung cancer screening is recommended for adults aged 69-80 years who are at high risk for developing lung cancer because of a history of smoking. A yearly low-dose CT scan of the lungs is recommended for people who have at least a 30-pack-year history of smoking and are current smokers or have quit within the past 15 years. A pack year of smoking is smoking an average of 1 pack of cigarettes a day for 1 year (for example, a 30-pack-year history of smoking could mean smoking 1 pack a day for 30 years or 2 packs a day for 15 years). Yearly screening should continue until the smoker has stopped smoking for at least 15 years. Yearly screening should be stopped for people who develop a health problem that would prevent them from having lung cancer treatment.  If you choose to drink alcohol, do not have more than 2 drinks per day. One drink is considered to be 12 oz (360 mL) of beer, 5 oz (150 mL) of wine, or 1.5 oz (45 mL) of liquor.  Avoid the use of street drugs. Do not share needles with anyone. Ask for help if you need support or instructions about stopping the use of drugs.  High blood pressure causes heart disease and increases the risk of stroke. High blood pressure is more likely to develop in:  People who have blood pressure in the end of the normal range (100-139/85-89 mm Hg).  People who are overweight or obese.  People who are African American.  If you are 84-22 years of age, have your blood pressure checked every 3-5 years. If you are 53 years of age or older, have your blood pressure checked every year. You should have your blood pressure measured twice-once when you are at a hospital or clinic, and once when you are not at a hospital or clinic. Record the average of the two measurements. To check your blood pressure when you are not at a hospital or clinic, you can use:  An automated blood pressure machine at a pharmacy.  A home blood pressure  monitor.  If you are 46-64 years old, ask your health care provider if you should take aspirin to prevent heart disease.  Diabetes screening involves taking a blood sample to check your fasting blood sugar level. This should be done once every 3 years after age 23 if you are at a normal weight and without risk factors for diabetes. Testing should be considered at a younger age or be carried out more frequently if you are overweight and have at least 1 risk factor for diabetes.  Colorectal cancer can be detected and often prevented. Most routine colorectal cancer screening begins at the age of 58 and continues through age 23. However, your health care provider may recommend screening at an earlier age if you have risk factors for colon cancer. On a yearly basis, your health care provider may provide home test kits to check for hidden blood in the stool. A small camera at the end of a tube may be used to directly examine the colon (sigmoidoscopy or colonoscopy) to detect the earliest forms of colorectal cancer. Talk to your health care provider about this at age 21 when routine screening begins. A direct exam of the colon should be repeated every 5-10 years through age 54, unless early forms of precancerous polyps or small growths are found.  People who are at an increased risk for hepatitis B should be screened for this virus. You are considered at high risk for hepatitis B if:  You were born in a country where hepatitis B occurs often. Talk with your health care provider about which countries are considered high risk.  Your parents were born in a high-risk country and you have not received a shot to protect against hepatitis B (hepatitis B vaccine).  You have HIV or AIDS.  You use needles to inject street drugs.  You live with, or have sex with, someone who has hepatitis B.  You are a man who has sex with other men (MSM).  You get hemodialysis treatment.  You take certain medicines for  conditions like cancer, organ transplantation, and autoimmune conditions.  Hepatitis C blood testing is recommended for all people born from [birth year] through 1965 and any individual with known risk factors for hepatitis C.  Healthy men should no longer receive prostate-specific antigen (PSA) blood tests as part of routine cancer screening. Talk to your health care provider about prostate cancer screening.  Testicular cancer screening is not recommended for adolescents or adult males who have no symptoms. Screening includes self-exam, a health care provider exam, and other screening tests. Consult with your health care provider about any symptoms you have or any concerns you have about testicular cancer.  Practice safe sex. Use condoms and avoid high-risk sexual practices to reduce the spread of sexually transmitted infections (STIs).  You should be screened for STIs, including gonorrhea and chlamydia if:  You are sexually active and are  younger than 24 years.  You are older than 24 years, and your health care provider tells you that you are at risk for this type of infection.  Your sexual activity has changed since you were last screened, and you are at an increased risk for chlamydia or gonorrhea. Ask your health care provider if you are at risk.  If you are at risk of being infected with HIV, it is recommended that you take a prescription medicine daily to prevent HIV infection. This is called pre-exposure prophylaxis (PrEP). You are considered at risk if:  You are a man who has sex with other men (MSM).  You are a heterosexual man who is sexually active with multiple partners.  You take drugs by injection.  You are sexually active with a partner who has HIV.  Talk with your health care provider about whether you are at high risk of being infected with HIV. If you choose to begin PrEP, you should first be tested for HIV. You should then be tested every 3 months for as long as you are taking  PrEP.  Use sunscreen. Apply sunscreen liberally and repeatedly throughout the day. You should seek shade when your shadow is shorter than you. Protect yourself by wearing long sleeves, pants, a wide-brimmed hat, and sunglasses year round whenever you are outdoors.  Tell your health care provider of new moles or changes in moles, especially if there is a change in shape or color. Also, tell your health care provider if a mole is larger than the size of a pencil eraser.  A one-time screening for abdominal aortic aneurysm (AAA) and surgical repair of large AAAs by ultrasound is recommended for men aged 82-75 years who are current or former smokers.  Stay current with your vaccines (immunizations). This information is not intended to replace advice given to you by your health care provider. Make sure you discuss any questions you have with your health care provider. Document Released: 02/16/2008 Document Revised: 09/10/2014 Document Reviewed: 05/24/2015 Elsevier Interactive Patient Education  2017 [employer].
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
## 2016-10-30 ENCOUNTER — Telehealth: Payer: Self-pay | Admitting: Internal Medicine

## 2016-10-30 NOTE — Telephone Encounter (Signed)
Will forward to pcp

## 2016-10-30 NOTE — Telephone Encounter (Signed)
Pt. Came into facility requesting a letter from his PCP stating the days he was unable to work. Pt. Received a letter in the mail requesting a statement. Pt. Letter will be put in PCP box. Pt. Also stated that he needs the letter ASAP b/c he has to send it to [facility]. Please f/u with pt.

## 2016-10-31 ENCOUNTER — Encounter: Payer: Self-pay | Admitting: Internal Medicine
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
## 2016-11-07 ENCOUNTER — Encounter: Payer: Self-pay | Admitting: Internal Medicine

## 2016-11-08 ENCOUNTER — Telehealth: Payer: Self-pay | Admitting: Internal Medicine

## 2016-11-08 NOTE — Telephone Encounter (Signed)
Patient called to speak with nurse regarding the status of this letter that he came to request yesterday. Please follow up.  Thank you.

## 2016-11-13 ENCOUNTER — Encounter: Payer: Self-pay | Admitting: Internal Medicine

## 2016-11-13 ENCOUNTER — Ambulatory Visit: Payer: No Typology Code available for payment source | Attending: Internal Medicine | Admitting: Internal Medicine

## 2016-11-13 ENCOUNTER — Other Ambulatory Visit: Payer: Self-pay | Admitting: Internal Medicine

## 2016-11-13 VITALS — BP 130/92 | HR 108 | Temp 97.8°F | Ht 67.0 in | Wt 146.0 lb

## 2016-11-13 DIAGNOSIS — M79605 Pain in left leg: Secondary | ICD-10-CM | POA: Diagnosis present

## 2016-11-13 DIAGNOSIS — I10 Essential (primary) hypertension: Secondary | ICD-10-CM | POA: Diagnosis not present

## 2016-11-13 DIAGNOSIS — S71102A Unspecified open wound, left thigh, initial encounter: Secondary | ICD-10-CM

## 2016-11-13 DIAGNOSIS — S71132A Puncture wound without foreign body, left thigh, initial encounter: Secondary | ICD-10-CM

## 2016-11-13 DIAGNOSIS — Z79899 Other long term (current) drug therapy: Secondary | ICD-10-CM | POA: Insufficient documentation

## 2016-11-13 DIAGNOSIS — W3400XA Accidental discharge from unspecified firearms or gun, initial encounter: Secondary | ICD-10-CM

## 2016-11-13 MED ORDER — ACETAMINOPHEN-CODEINE #3 300-30 MG PO TABS
1.0000 | ORAL_TABLET | ORAL | 0 refills | Status: DC | PRN
Start: 2016-11-13 — End: 2017-12-17

## 2016-11-13 MED ORDER — DICLOFENAC SODIUM 1 % TD GEL: 2.0000 g | Freq: Four times a day (QID) | TRANSDERMAL | 2 refills | Status: DC

## 2016-11-13 MED ORDER — HYDROCHLOROTHIAZIDE 25 MG PO TABS: 25.0000 mg | ORAL_TABLET | Freq: Every day | ORAL | 3 refills | Status: DC

## 2016-11-13 MED ORDER — GABAPENTIN 300 MG PO CAPS: 300.0000 mg | ORAL_CAPSULE | Freq: Three times a day (TID) | ORAL | 3 refills | Status: DC

## 2016-11-13 NOTE — Patient Instructions (Signed)
Low-Sodium Eating Plan Sodium, which is an element that makes up salt, helps you maintain a healthy balance of fluids in your body. Too much sodium can increase your blood pressure and cause fluid and waste to be held in your body. Your health care provider or dietitian may recommend following this plan if you have high blood pressure (hypertension), kidney disease, liver disease, or heart failure. Eating less sodium can help lower your blood pressure, reduce swelling, and protect your heart, liver, and kidneys. What are tips for following this plan? General guidelines   Most people on this plan should limit their sodium intake to 1,500-2,000 mg (milligrams) of sodium each day. Reading food labels   The Nutrition Facts label lists the amount of sodium in one serving of the food. If you eat more than one serving, you must multiply the listed amount of sodium by the number of servings.  Choose foods with less than 140 mg of sodium per serving.  Avoid foods with 300 mg of sodium or more per serving. Shopping   Look for lower-sodium products, often labeled as "low-sodium" or "no salt added."  Always check the sodium content even if foods are labeled as "unsalted" or "no salt added".  Buy fresh foods.  Avoid canned foods and premade or frozen meals.  Avoid canned, cured, or processed meats  Buy breads that have less than 80 mg of sodium per slice. Cooking   Eat more home-cooked food and less restaurant, buffet, and fast food.  Avoid adding salt when cooking. Use salt-free seasonings or herbs instead of table salt or sea salt. Check with your health care provider or pharmacist before using salt substitutes.  Cook with plant-based oils, such as canola, sunflower, or olive oil. Meal planning   When eating at a restaurant, ask that your food be prepared with less salt or no salt, if possible.  Avoid foods that contain MSG (monosodium glutamate). MSG is sometimes added to [place] food,  bouillon, and some canned foods. What foods are recommended? The items listed may not be a complete list. Talk with your dietitian about what dietary choices are best for you. Grains  Low-sodium cereals, including oats, puffed wheat and rice, and shredded wheat. Low-sodium crackers. Unsalted rice. Unsalted pasta. Low-sodium bread. Whole-grain breads and whole-grain pasta. Vegetables  Fresh or frozen vegetables. "No salt added" canned vegetables. "No salt added" tomato sauce and paste. Low-sodium or reduced-sodium tomato and vegetable juice. Fruits  Fresh, frozen, or canned fruit. Fruit juice. Meats and other protein foods  Fresh or frozen (no salt added) meat, poultry, seafood, and fish. Low-sodium canned tuna and salmon. Unsalted nuts. Dried peas, beans, and lentils without added salt. Unsalted canned beans. Eggs. Unsalted nut butters. Dairy  Milk. Soy milk. Cheese that is naturally low in sodium, such as ricotta cheese, fresh mozzarella, or Swiss cheese Low-sodium or reduced-sodium cheese. Cream cheese. Yogurt. Fats and oils  Unsalted butter. Unsalted margarine with no trans fat. Vegetable oils such as canola or olive oils. Seasonings and other foods  Fresh and dried herbs and spices. Salt-free seasonings. Low-sodium mustard and ketchup. Sodium-free salad dressing. Sodium-free light mayonnaise. Fresh or refrigerated horseradish. Lemon juice. Vinegar. Homemade, reduced-sodium, or low-sodium soups. Unsalted popcorn and pretzels. Low-salt or salt-free chips. What foods are not recommended? The items listed may not be a complete list. Talk with your dietitian about what dietary choices are best for you. Grains  Instant hot cereals. Bread stuffing, pancake, and biscuit mixes. Croutons. Seasoned rice or pasta  mixes. Noodle soup cups. Boxed or frozen macaroni and cheese. Regular salted crackers. Self-rising flour. Vegetables  Sauerkraut, pickled vegetables, and relishes. Olives. [place] fries. Onion  rings. Regular canned vegetables (not low-sodium or reduced-sodium). Regular canned tomato sauce and paste (not low-sodium or reduced-sodium). Regular tomato and vegetable juice (not low-sodium or reduced-sodium). Frozen vegetables in sauces. Meats and other protein foods  Meat or fish that is salted, canned, smoked, spiced, or pickled. Bacon, ham, sausage, hotdogs, corned beef, chipped beef, packaged lunch meats, salt pork, jerky, pickled herring, anchovies, regular canned tuna, sardines, salted nuts. Dairy  Processed cheese and cheese spreads. Cheese curds. Blue cheese. Feta cheese. String cheese. Regular cottage cheese. Buttermilk. Canned milk. Fats and oils  Salted butter. Regular margarine. Ghee. Bacon fat. Seasonings and other foods  Onion salt, garlic salt, seasoned salt, table salt, and sea salt. Canned and packaged gravies. Worcestershire sauce. Tartar sauce. Barbecue sauce. Teriyaki sauce. Soy sauce, including reduced-sodium. Steak sauce. Fish sauce. Oyster sauce. Cocktail sauce. Horseradish that you find on the shelf. Regular ketchup and mustard. Meat flavorings and tenderizers. Bouillon cubes. Hot sauce and Tabasco sauce. Premade or packaged marinades. Premade or packaged taco seasonings. Relishes. Regular salad dressings. Salsa. Potato and tortilla chips. Corn chips and puffs. Salted popcorn and pretzels. Canned or dried soups. Pizza. Frozen entrees and pot pies. Summary  Eating less sodium can help lower your blood pressure, reduce swelling, and protect your heart, liver, and kidneys.  Most people on this plan should limit their sodium intake to 1,500-2,000 mg (milligrams) of sodium each day.  Canned, boxed, and frozen foods are high in sodium. Restaurant foods, fast foods, and pizza are also very high in sodium. You also get sodium by adding salt to food.  Try to cook at home, eat more fresh fruits and vegetables, and eat less fast food, canned, processed, or prepared foods. This  information is not intended to replace advice given to you by your health care provider. Make sure you discuss any questions you have with your health care provider. Document Released: 02/09/2002 Document Revised: 08/13/2016 Document Reviewed: 08/13/2016 Elsevier Interactive Patient Education  2017 Else[employer] Inc.  -  Hypertension Hypertension is another name for high blood pressure. High blood pressure forces your heart to work harder to pump blood. This can cause problems over time. There are two numbers in a blood pressure reading. There is a top number (systolic) over a bottom number (diastolic). It is best to have a blood pressure below 120/80. Healthy choices can help lower your blood pressure. You may need medicine to help lower your blood pressure if:  Your blood pressure cannot be lowered with healthy choices.  Your blood pressure is higher than 130/80. Follow these instructions at home: Eating and drinking   If directed, follow the DASH eating plan. This diet includes:  Filling half of your plate at each meal with fruits and vegetables.  Filling one quarter of your plate at each meal with whole grains. Whole grains include whole wheat pasta, brown rice, and whole grain bread.  Eating or drinking low-fat dairy products, such as skim milk or low-fat yogurt.  Filling one quarter of your plate at each meal with low-fat (lean) proteins. Low-fat proteins include fish, skinless chicken, eggs, beans, and tofu.  Avoiding fatty meat, cured and processed meat, or chicken with skin.  Avoiding premade or processed food.  Eat less than 1,500 mg of salt (sodium) a day.  Limit alcohol use to no more than 1 drink a  day for nonpregnant women and 2 drinks a day for men. One drink equals 12 oz of beer, 5 oz of wine, or 1 oz of hard liquor. Lifestyle   Work with your doctor to stay at a healthy weight or to lose weight. Ask your doctor what the best weight is for you.  Get at least 30  minutes of exercise that causes your heart to beat faster (aerobic exercise) most days of the week. This may include walking, swimming, or biking.  Get at least 30 minutes of exercise that strengthens your muscles (resistance exercise) at least 3 days a week. This may include lifting weights or pilates.  Do not use any products that contain nicotine or tobacco. This includes cigarettes and e-cigarettes. If you need help quitting, ask your doctor.  Check your blood pressure at home as told by your doctor.  Keep all follow-up visits as told by your doctor. This is important. Medicines   Take over-the-counter and prescription medicines only as told by your doctor. Follow directions carefully.  Do not skip doses of blood pressure medicine. The medicine does not work as well if you skip doses. Skipping doses also puts you at risk for problems.  Ask your doctor about side effects or reactions to medicines that you should watch for. Contact a doctor if:  You think you are having a reaction to the medicine you are taking.  You have headaches that keep coming back (recurring).  You feel dizzy.  You have swelling in your ankles.  You have trouble with your vision. Get help right away if:  You get a very bad headache.  You start to feel confused.  You feel weak or numb.  You feel faint.  You get very bad pain in your:  Chest.  Belly (abdomen).  You throw up (vomit) more than once.  You have trouble breathing. Summary  Hypertension is another name for high blood pressure.  Making healthy choices can help lower blood pressure. If your blood pressure cannot be controlled with healthy choices, you may need to take medicine. This information is not intended to replace advice given to you by your health care provider. Make sure you discuss any questions you have with your health care provider. Document Released: 02/06/2008 Document Revised: 07/18/2016 Document Reviewed:  07/18/2016 Elsevier Interactive Patient Education  2017 [employer].
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
## 2016-11-14 MED FILL — HYDROCHLOROTHIAZIDE 25 MG T: 25 | 30 days supply | Qty: 30 | Fill #0

## 2016-11-14 MED FILL — ACETAMINOPHEN/COD #3 TABLET: 300-30 | 8 days supply | Qty: 45 | Fill #0

## 2016-11-18 LAB — DRUG ABUSE 10-50+ETHANOL, U
ALCOHOL, ETHYL (U): POSITIVE — AB
AMPHETAMINES (1000 NG/ML SCRN): NEGATIVE
BARBITURATES: NEGATIVE
BENZODIAZEPINES: NEGATIVE
COCAINE METABOLITES: POSITIVE — AB
MARIJUANA MET (50 NG/ML SCRN): NEGATIVE
METHADONE: NEGATIVE
METHAQUALONE: NEGATIVE
OPIATES: NEGATIVE
PHENCYCLIDINE: NEGATIVE
PROPOXYPHENE: NEGATIVE

## 2016-11-21 ENCOUNTER — Telehealth: Payer: Self-pay

## 2016-11-21 ENCOUNTER — Ambulatory Visit (INDEPENDENT_AMBULATORY_CARE_PROVIDER_SITE_OTHER): Payer: Self-pay | Admitting: Family Medicine

## 2016-11-21 ENCOUNTER — Encounter: Payer: Self-pay | Admitting: Family Medicine

## 2016-11-21 VITALS — BP 150/98 | Ht 67.0 in | Wt 160.0 lb

## 2016-11-21 DIAGNOSIS — M79605 Pain in left leg: Secondary | ICD-10-CM

## 2016-11-21 MED ORDER — TRAMADOL HCL 50 MG PO TABS: 50.0000 mg | ORAL_TABLET | Freq: Three times a day (TID) | ORAL | 0 refills | Status: DC | PRN

## 2016-11-21 MED ORDER — MELOXICAM 15 MG PO TABS: 15.0000 mg | ORAL_TABLET | Freq: Every day | ORAL | 1 refills | Status: DC

## 2016-11-21 NOTE — Telephone Encounter (Signed)
Contacted pt to go over drug screen results pt is aware of results and is aware that we will no longer prescribed narcotics from this office. Pt states he understands
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
## 2016-11-22 DIAGNOSIS — M79605 Pain in left leg: Secondary | ICD-10-CM | POA: Insufficient documentation
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
## 2016-11-29 ENCOUNTER — Ambulatory Visit: Payer: No Typology Code available for payment source | Attending: Family Medicine

## 2016-12-31 ENCOUNTER — Telehealth: Payer: Self-pay | Admitting: Internal Medicine

## 2016-12-31 NOTE — Telephone Encounter (Signed)
Letter complete. Please mail 1 letter and give pt a copy of it as well. thanks

## 2016-12-31 NOTE — Telephone Encounter (Signed)
PT came to the office request a letter from you, releasing when he can  go back to work, he need this letter to be sent to the [employer] [address], [place], [place] [address] Phone [phone] Fax [phone]  He also need a copy of the letter for his records Please follow up with PT PT is back at work but they asking him that he need a letter from the pcp that he can be at work

## 2017-01-17 ENCOUNTER — Encounter: Payer: Self-pay | Admitting: Internal Medicine

## 2017-01-18 ENCOUNTER — Encounter: Payer: Self-pay | Admitting: Internal Medicine

## 2017-01-21 ENCOUNTER — Encounter: Payer: Self-pay | Admitting: Internal Medicine

## 2017-09-04 ENCOUNTER — Emergency Department (HOSPITAL_COMMUNITY)
Admission: EM | Admit: 2017-09-04 | Discharge: 2017-09-04 | Disposition: A | Discharge status: Home / Self Care | Payer: Self-pay | Attending: Emergency Medicine | Admitting: Emergency Medicine

## 2017-09-04 ENCOUNTER — Other Ambulatory Visit: Payer: Self-pay

## 2017-09-04 ENCOUNTER — Emergency Department (HOSPITAL_COMMUNITY): Payer: Self-pay

## 2017-09-04 ENCOUNTER — Encounter (HOSPITAL_COMMUNITY): Payer: Self-pay | Admitting: Emergency Medicine

## 2017-09-04 DIAGNOSIS — M79601 Pain in right arm: Secondary | ICD-10-CM | POA: Insufficient documentation

## 2017-09-04 DIAGNOSIS — I10 Essential (primary) hypertension: Secondary | ICD-10-CM | POA: Insufficient documentation

## 2017-09-04 DIAGNOSIS — Z79899 Other long term (current) drug therapy: Secondary | ICD-10-CM | POA: Insufficient documentation

## 2017-09-04 DIAGNOSIS — F17290 Nicotine dependence, other tobacco product, uncomplicated: Secondary | ICD-10-CM | POA: Insufficient documentation

## 2017-09-04 MED ORDER — HYDROCODONE-ACETAMINOPHEN 5-325 MG PO TABS: 1.0000 | ORAL_TABLET | Freq: Four times a day (QID) | ORAL | 0 refills | Status: DC | PRN

## 2017-09-04 MED ORDER — NAPROXEN 500 MG PO TABS: 500.0000 mg | ORAL_TABLET | Freq: Two times a day (BID) | ORAL | 0 refills | Status: DC | PRN

## 2017-09-04 NOTE — Discharge Instructions (Signed)
It was my pleasure taking care of you today!   Take naproxen as needed for mild to moderate pain.  Norco only as needed for severe pain - This can make you very drowsy - please do not drink alcohol, operate heavy machinery or drive on this medication.   Ice or heat to the area will help with pain as well.  If symptoms are not improving in the next 2-3 days, please call the orthopedic doctor listed to schedule a follow-up appointment.  Return to ER for new or worsening symptoms, any additional concerns.
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
## 2017-12-09 ENCOUNTER — Ambulatory Visit (HOSPITAL_COMMUNITY)
Admission: EM | Admit: 2017-12-09 | Discharge: 2017-12-09 | Disposition: A | Discharge status: Home / Self Care | Payer: BLUE CROSS/BLUE SHIELD | Attending: Family Medicine | Admitting: Family Medicine

## 2017-12-09 ENCOUNTER — Encounter (HOSPITAL_COMMUNITY): Payer: Self-pay | Admitting: Emergency Medicine

## 2017-12-09 DIAGNOSIS — M62830 Muscle spasm of back: Secondary | ICD-10-CM

## 2017-12-09 DIAGNOSIS — M545 Low back pain, unspecified: Secondary | ICD-10-CM

## 2017-12-09 MED ORDER — DICLOFENAC SODIUM 75 MG PO TBEC: 75.0000 mg | DELAYED_RELEASE_TABLET | Freq: Two times a day (BID) | ORAL | 0 refills | Status: DC

## 2017-12-09 MED ORDER — CYCLOBENZAPRINE HCL 10 MG PO TABS: 10.0000 mg | ORAL_TABLET | Freq: Three times a day (TID) | ORAL | 0 refills | Status: DC

## 2017-12-09 NOTE — Discharge Instructions (Signed)

## 2017-12-09 NOTE — ED Triage Notes (Signed)
Pt sts lower back pain x 2 weeks
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
## 2017-12-13 ENCOUNTER — Encounter (HOSPITAL_COMMUNITY): Payer: Self-pay | Admitting: Emergency Medicine

## 2017-12-13 ENCOUNTER — Emergency Department (HOSPITAL_COMMUNITY)
Admission: EM | Admit: 2017-12-13 | Discharge: 2017-12-14 | Disposition: A | Discharge status: Home / Self Care | Payer: BLUE CROSS/BLUE SHIELD | Attending: Emergency Medicine | Admitting: Emergency Medicine

## 2017-12-13 ENCOUNTER — Other Ambulatory Visit: Payer: Self-pay

## 2017-12-13 DIAGNOSIS — Z5321 Procedure and treatment not carried out due to patient leaving prior to being seen by health care provider: Secondary | ICD-10-CM | POA: Diagnosis not present

## 2017-12-13 DIAGNOSIS — M545 Low back pain: Secondary | ICD-10-CM | POA: Diagnosis not present

## 2017-12-13 NOTE — ED Triage Notes (Addendum)
C/o lower back pain x 2 weeks that is worse when bending over.  States pain occasionally radiates down R leg.  States he was seen at [facility] recently and given Rx but it hasn't helped.  Denies urinary complaints.

## 2017-12-14 ENCOUNTER — Encounter (HOSPITAL_COMMUNITY): Payer: Self-pay | Admitting: *Deleted

## 2017-12-14 ENCOUNTER — Ambulatory Visit (HOSPITAL_COMMUNITY)
Admission: EM | Admit: 2017-12-14 | Discharge: 2017-12-14 | Disposition: A | Discharge status: Home / Self Care | Payer: BLUE CROSS/BLUE SHIELD | Source: Home / Self Care | Attending: Family Medicine | Admitting: Family Medicine

## 2017-12-14 DIAGNOSIS — M545 Low back pain, unspecified: Secondary | ICD-10-CM

## 2017-12-14 MED ORDER — PREDNISONE 10 MG PO TABS: 20.0000 mg | ORAL_TABLET | Freq: Every day | ORAL | 0 refills | Status: DC

## 2017-12-14 MED ORDER — HYDROCODONE-ACETAMINOPHEN 5-325 MG PO TABS: 1.0000 | ORAL_TABLET | ORAL | 0 refills | Status: AC | PRN

## 2017-12-14 NOTE — ED Notes (Addendum)
Patient was seen leaving the emergency room.

## 2017-12-14 NOTE — ED Triage Notes (Signed)
Reports has not been taking HTN med "in a while".  Reports low back pain radiating down RLE x 2 wks without known injury.  Was seen in [facility] 4/8 - given Rx for cyclobenzaprine and diclofenac, but states "not helping".  Went to ED yesterday, but LWBS.  Denies any parasthesias.
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
## 2017-12-17 ENCOUNTER — Ambulatory Visit (HOSPITAL_COMMUNITY)
Admission: EM | Admit: 2017-12-17 | Discharge: 2017-12-17 | Disposition: A | Discharge status: Home / Self Care | Payer: BLUE CROSS/BLUE SHIELD | Attending: Family Medicine | Admitting: Family Medicine

## 2017-12-17 ENCOUNTER — Encounter (HOSPITAL_COMMUNITY): Payer: Self-pay | Admitting: Family Medicine

## 2017-12-17 DIAGNOSIS — M545 Low back pain, unspecified: Secondary | ICD-10-CM

## 2017-12-17 MED ORDER — MELOXICAM 15 MG PO TABS: 15.0000 mg | ORAL_TABLET | Freq: Every day | ORAL | 0 refills | Status: DC

## 2017-12-17 NOTE — Discharge Instructions (Signed)
Start mobic as directed. Continue prednisone as directed. You can continue hydrocodone as needed for the pain. Follow up with orthopedics for further evaluation and management needed. If experience numbness/tingling of the inner thighs, loss of bladder or bowel control, go to the emergency department for evaluation.
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
## 2017-12-17 NOTE — ED Triage Notes (Signed)
Pt here for lower back pain. Taking the meds that were prescribed Saturday and reports not helping.
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
## 2018-03-18 ENCOUNTER — Ambulatory Visit (HOSPITAL_COMMUNITY)
Admission: EM | Admit: 2018-03-18 | Discharge: 2018-03-18 | Disposition: A | Discharge status: Home / Self Care | Payer: BLUE CROSS/BLUE SHIELD | Attending: Family Medicine | Admitting: Family Medicine

## 2018-03-18 ENCOUNTER — Ambulatory Visit (INDEPENDENT_AMBULATORY_CARE_PROVIDER_SITE_OTHER): Payer: BLUE CROSS/BLUE SHIELD

## 2018-03-18 ENCOUNTER — Other Ambulatory Visit: Payer: Self-pay

## 2018-03-18 ENCOUNTER — Encounter (HOSPITAL_COMMUNITY): Payer: Self-pay | Admitting: Emergency Medicine

## 2018-03-18 DIAGNOSIS — R509 Fever, unspecified: Secondary | ICD-10-CM | POA: Diagnosis not present

## 2018-03-18 DIAGNOSIS — R1013 Epigastric pain: Secondary | ICD-10-CM

## 2018-03-18 MED ORDER — ONDANSETRON 4 MG PO TBDP
4.0000 mg | ORAL_TABLET | Freq: Once | ORAL | Status: AC
  Administered 2018-03-18: 4 mg via ORAL

## 2018-03-18 MED ORDER — ONDANSETRON 4 MG PO TBDP
ORAL_TABLET | ORAL | Status: AC
  Filled 2018-03-18: qty 1

## 2018-03-18 MED ORDER — ACETAMINOPHEN 325 MG PO TABS
ORAL_TABLET | ORAL | Status: AC
  Filled 2018-03-18: qty 2

## 2018-03-18 MED ORDER — ACETAMINOPHEN 325 MG PO TABS
650.0000 mg | ORAL_TABLET | Freq: Once | ORAL | Status: AC
  Administered 2018-03-18: 650 mg via ORAL

## 2018-03-18 MED ORDER — ONDANSETRON 4 MG PO TBDP: 4.0000 mg | ORAL_TABLET | Freq: Three times a day (TID) | ORAL | 0 refills | Status: DC | PRN

## 2018-03-18 NOTE — ED Triage Notes (Signed)
The patient presented to the [facility] with a complaint of abdominal pain and fever that started today.

## 2018-03-18 NOTE — Discharge Instructions (Addendum)
Xray negative for pneumonia. As discussed, could be a stomach bug right now as exam is not alarming. Zofran for nausea and vomiting as needed. Keep hydrated, you urine should be clear to pale yellow in color. Bland diet, advance as tolerated. Monitor for any worsening of symptoms, nausea or vomiting not controlled by medication, worsening abdominal pain go to the emergency department for further evaluation needed.
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
## 2018-06-05 ENCOUNTER — Encounter (HOSPITAL_COMMUNITY): Payer: Self-pay | Admitting: Emergency Medicine

## 2018-06-05 ENCOUNTER — Ambulatory Visit (HOSPITAL_COMMUNITY)
Admission: EM | Admit: 2018-06-05 | Discharge: 2018-06-05 | Disposition: A | Discharge status: Home / Self Care | Payer: BLUE CROSS/BLUE SHIELD | Attending: Family Medicine | Admitting: Family Medicine

## 2018-06-05 DIAGNOSIS — J069 Acute upper respiratory infection, unspecified: Secondary | ICD-10-CM | POA: Diagnosis not present

## 2018-06-05 DIAGNOSIS — B9789 Other viral agents as the cause of diseases classified elsewhere: Secondary | ICD-10-CM | POA: Diagnosis not present

## 2018-06-05 MED ORDER — TRIAMCINOLONE ACETONIDE 55 MCG/ACT NA AERO: 2.0000 | INHALATION_SPRAY | Freq: Every day | NASAL | 12 refills | Status: DC

## 2018-06-05 MED ORDER — GUAIFENESIN ER 600 MG PO TB12: 1200.0000 mg | ORAL_TABLET | Freq: Two times a day (BID) | ORAL | 0 refills | Status: AC | PRN

## 2018-06-05 MED ORDER — ALBUTEROL SULFATE HFA 108 (90 BASE) MCG/ACT IN AERS: 1.0000 | INHALATION_SPRAY | Freq: Four times a day (QID) | RESPIRATORY_TRACT | 0 refills | Status: DC | PRN

## 2018-06-05 NOTE — ED Triage Notes (Signed)
Pt reports "chest and head cold" for 3-4 days with some fatigue and wheezing.

## 2018-06-05 NOTE — Discharge Instructions (Signed)
Push fluids to ensure adequate hydration and keep secretions thin.  Tylenol and/or ibuprofen as needed for pain or fevers.  Inhaler as needed for wheezing/cough/shortness of breath .  Mucinex as an expectorant may also be helpful as well as daily nasal spray.  If symptoms worsen or do not improve in the next week to return to be seen or to follow up with PCP.   Return sooner if develop worsening of shortness of breath , cough, fevers, dizziness, or otherwise worsening
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
## 2018-09-14 ENCOUNTER — Emergency Department (HOSPITAL_COMMUNITY): Payer: BLUE CROSS/BLUE SHIELD | Admitting: Anesthesiology

## 2018-09-14 ENCOUNTER — Emergency Department (HOSPITAL_COMMUNITY)
Admission: EM | Admit: 2018-09-14 | Discharge: 2018-09-14 | Disposition: A | Discharge status: Home / Self Care | Payer: BLUE CROSS/BLUE SHIELD | Attending: Emergency Medicine | Admitting: Emergency Medicine

## 2018-09-14 ENCOUNTER — Encounter (HOSPITAL_COMMUNITY)
Admission: EM | Disposition: A | Discharge status: Home / Self Care | Payer: Self-pay | Source: Home / Self Care | Attending: Emergency Medicine

## 2018-09-14 ENCOUNTER — Emergency Department (HOSPITAL_COMMUNITY): Payer: BLUE CROSS/BLUE SHIELD

## 2018-09-14 ENCOUNTER — Encounter (HOSPITAL_COMMUNITY): Payer: Self-pay

## 2018-09-14 ENCOUNTER — Other Ambulatory Visit: Payer: Self-pay

## 2018-09-14 DIAGNOSIS — I10 Essential (primary) hypertension: Secondary | ICD-10-CM | POA: Insufficient documentation

## 2018-09-14 DIAGNOSIS — X58XXXA Exposure to other specified factors, initial encounter: Secondary | ICD-10-CM | POA: Insufficient documentation

## 2018-09-14 DIAGNOSIS — Y999 Unspecified external cause status: Secondary | ICD-10-CM | POA: Insufficient documentation

## 2018-09-14 DIAGNOSIS — Y939 Activity, unspecified: Secondary | ICD-10-CM | POA: Insufficient documentation

## 2018-09-14 DIAGNOSIS — S39840A Fracture of corpus cavernosum penis, initial encounter: Secondary | ICD-10-CM | POA: Insufficient documentation

## 2018-09-14 DIAGNOSIS — F17290 Nicotine dependence, other tobacco product, uncomplicated: Secondary | ICD-10-CM | POA: Insufficient documentation

## 2018-09-14 DIAGNOSIS — Y929 Unspecified place or not applicable: Secondary | ICD-10-CM | POA: Insufficient documentation

## 2018-09-14 HISTORY — PX: REPAIR OF FRACTURED PENIS: SHX6063

## 2018-09-14 SURGERY — REPAIR, FRACTURE, PENIS
Anesthesia: General | Site: Penis

## 2018-09-14 MED ORDER — SUGAMMADEX SODIUM 200 MG/2ML IV SOLN
INTRAVENOUS | Status: AC
  Filled 2018-09-14: qty 2

## 2018-09-14 MED ORDER — IBUPROFEN 200 MG PO TABS
600.0000 mg | ORAL_TABLET | Freq: Once | ORAL | Status: AC
  Administered 2018-09-14: 600 mg via ORAL
  Filled 2018-09-14: qty 3

## 2018-09-14 MED ORDER — SUCCINYLCHOLINE CHLORIDE 200 MG/10ML IV SOSY
PREFILLED_SYRINGE | INTRAVENOUS | Status: DC | PRN
  Administered 2018-09-14: 80 mg via INTRAVENOUS

## 2018-09-14 MED ORDER — LABETALOL HCL 5 MG/ML IV SOLN
5.0000 mg | INTRAVENOUS | Status: AC | PRN
  Administered 2018-09-14 (×4): 5 mg via INTRAVENOUS

## 2018-09-14 MED ORDER — LIDOCAINE HCL (PF) 1 % IJ SOLN
INTRAMUSCULAR | Status: DC | PRN
  Administered 2018-09-14: 10 mL

## 2018-09-14 MED ORDER — CEFAZOLIN SODIUM-DEXTROSE 2-4 GM/100ML-% IV SOLN
2.0000 g | Freq: Three times a day (TID) | INTRAVENOUS | Status: DC
  Administered 2018-09-14: 2 g via INTRAVENOUS

## 2018-09-14 MED ORDER — LACTATED RINGERS IV SOLN
INTRAVENOUS | Status: DC | PRN
  Administered 2018-09-14: 12:00:00 via INTRAVENOUS

## 2018-09-14 MED ORDER — DEXAMETHASONE SODIUM PHOSPHATE 10 MG/ML IJ SOLN
INTRAMUSCULAR | Status: DC | PRN
  Administered 2018-09-14: 8 mg via INTRAVENOUS

## 2018-09-14 MED ORDER — PROPOFOL 10 MG/ML IV BOLUS
INTRAVENOUS | Status: DC | PRN
  Administered 2018-09-14: 150 mg via INTRAVENOUS

## 2018-09-14 MED ORDER — CEPHALEXIN 500 MG PO CAPS: 500.0000 mg | ORAL_CAPSULE | Freq: Three times a day (TID) | ORAL | 0 refills | Status: AC

## 2018-09-14 MED ORDER — HYDROCODONE-ACETAMINOPHEN 5-325 MG PO TABS: 1.0000 | ORAL_TABLET | ORAL | 0 refills | Status: AC | PRN

## 2018-09-14 MED ORDER — HYDROMORPHONE HCL 1 MG/ML IJ SOLN
INTRAMUSCULAR | Status: AC
  Filled 2018-09-14: qty 1

## 2018-09-14 MED ORDER — FENTANYL CITRATE (PF) 100 MCG/2ML IJ SOLN
INTRAMUSCULAR | Status: AC
  Filled 2018-09-14: qty 2

## 2018-09-14 MED ORDER — SUGAMMADEX SODIUM 200 MG/2ML IV SOLN
INTRAVENOUS | Status: DC | PRN
  Administered 2018-09-14: 150 mg via INTRAVENOUS

## 2018-09-14 MED ORDER — MEPERIDINE HCL 50 MG/ML IJ SOLN: 6.2500 mg | INTRAMUSCULAR | Status: DC | PRN

## 2018-09-14 MED ORDER — LABETALOL HCL 5 MG/ML IV SOLN
INTRAVENOUS | Status: AC
  Filled 2018-09-14: qty 4

## 2018-09-14 MED ORDER — KETOROLAC TROMETHAMINE 30 MG/ML IJ SOLN: 30.0000 mg | Freq: Once | INTRAMUSCULAR | Status: DC | PRN

## 2018-09-14 MED ORDER — DEXAMETHASONE SODIUM PHOSPHATE 10 MG/ML IJ SOLN
INTRAMUSCULAR | Status: AC
  Filled 2018-09-14: qty 2

## 2018-09-14 MED ORDER — BACITRACIN-NEOMYCIN-POLYMYXIN OINTMENT TUBE
TOPICAL_OINTMENT | CUTANEOUS | Status: DC | PRN
  Administered 2018-09-14: 1 via TOPICAL

## 2018-09-14 MED ORDER — HYDROCODONE-ACETAMINOPHEN 7.5-325 MG PO TABS: 1.0000 | ORAL_TABLET | Freq: Once | ORAL | Status: DC | PRN

## 2018-09-14 MED ORDER — 0.9 % SODIUM CHLORIDE (POUR BTL) OPTIME
TOPICAL | Status: DC | PRN
  Administered 2018-09-14: 1000 mL

## 2018-09-14 MED ORDER — LIDOCAINE HCL (PF) 1 % IJ SOLN
INTRAMUSCULAR | Status: AC
  Filled 2018-09-14: qty 30

## 2018-09-14 MED ORDER — ONDANSETRON HCL 4 MG/2ML IJ SOLN
INTRAMUSCULAR | Status: AC
  Filled 2018-09-14: qty 4

## 2018-09-14 MED ORDER — ONDANSETRON HCL 4 MG/2ML IJ SOLN: 4.0000 mg | Freq: Once | INTRAMUSCULAR | Status: DC | PRN

## 2018-09-14 MED ORDER — ONDANSETRON HCL 4 MG/2ML IJ SOLN
INTRAMUSCULAR | Status: DC | PRN
  Administered 2018-09-14: 4 mg via INTRAVENOUS

## 2018-09-14 MED ORDER — MIDAZOLAM HCL 5 MG/5ML IJ SOLN
INTRAMUSCULAR | Status: DC | PRN
  Administered 2018-09-14: 2 mg via INTRAVENOUS

## 2018-09-14 MED ORDER — HYDROMORPHONE HCL 1 MG/ML IJ SOLN
0.2500 mg | INTRAMUSCULAR | Status: DC | PRN
  Administered 2018-09-14 (×3): 0.5 mg via INTRAVENOUS

## 2018-09-14 MED ORDER — PROPOFOL 10 MG/ML IV BOLUS
INTRAVENOUS | Status: AC
  Filled 2018-09-14: qty 20

## 2018-09-14 MED ORDER — LIDOCAINE 2% (20 MG/ML) 5 ML SYRINGE
INTRAMUSCULAR | Status: DC | PRN
  Administered 2018-09-14: 80 mg via INTRAVENOUS

## 2018-09-14 MED ORDER — CEFAZOLIN SODIUM-DEXTROSE 2-4 GM/100ML-% IV SOLN
INTRAVENOUS | Status: AC
  Filled 2018-09-14: qty 100

## 2018-09-14 MED ORDER — BACITRACIN-NEOMYCIN-POLYMYXIN 400-5-5000 EX OINT
TOPICAL_OINTMENT | CUTANEOUS | Status: AC
  Filled 2018-09-14: qty 1

## 2018-09-14 MED ORDER — ROCURONIUM BROMIDE 10 MG/ML (PF) SYRINGE
PREFILLED_SYRINGE | INTRAVENOUS | Status: DC | PRN
  Administered 2018-09-14: 30 mg via INTRAVENOUS

## 2018-09-14 MED ORDER — HYDROCODONE-ACETAMINOPHEN 5-325 MG PO TABS
1.0000 | ORAL_TABLET | Freq: Once | ORAL | Status: AC
  Administered 2018-09-14: 1 via ORAL
  Filled 2018-09-14: qty 1

## 2018-09-14 MED ORDER — MIDAZOLAM HCL 2 MG/2ML IJ SOLN
INTRAMUSCULAR | Status: AC
  Filled 2018-09-14: qty 2

## 2018-09-14 MED ORDER — FENTANYL CITRATE (PF) 100 MCG/2ML IJ SOLN
INTRAMUSCULAR | Status: DC | PRN
  Administered 2018-09-14: 25 ug via INTRAVENOUS
  Administered 2018-09-14 (×3): 50 ug via INTRAVENOUS
  Administered 2018-09-14 (×3): 25 ug via INTRAVENOUS

## 2018-09-14 SURGICAL SUPPLY — 30 items
BLADE SURG 15 STRL LF DISP TIS (BLADE) ×1 IMPLANT
BLADE SURG 15 STRL SS (BLADE) ×3
BNDG COHESIVE 1X5 TAN STRL LF (GAUZE/BANDAGES/DRESSINGS) ×3 IMPLANT
BNDG CONFORM 2 STRL LF (GAUZE/BANDAGES/DRESSINGS) ×2 IMPLANT
CATH FOLEY 2WAY SLVR  5CC 16FR (CATHETERS) ×2
CATH FOLEY 2WAY SLVR 5CC 16FR (CATHETERS) IMPLANT
COVER WAND RF STERILE (DRAPES) IMPLANT
DRAIN PENROSE 18X1/4 LTX STRL (WOUND CARE) ×2 IMPLANT
DRSG TELFA 3X8 NADH (GAUZE/BANDAGES/DRESSINGS) ×3 IMPLANT
ELECT PENCIL ROCKER SW 15FT (MISCELLANEOUS) ×3 IMPLANT
ELECT REM PT RETURN 15FT ADLT (MISCELLANEOUS) ×3 IMPLANT
GAUZE PETROLATUM 1 X8 (GAUZE/BANDAGES/DRESSINGS) ×3 IMPLANT
GLOVE BIOGEL M STRL SZ7.5 (GLOVE) ×3 IMPLANT
GOWN STRL REUS W/TWL XL LVL3 (GOWN DISPOSABLE) ×3 IMPLANT
KIT BASIN OR (CUSTOM PROCEDURE TRAY) ×3 IMPLANT
NEEDLE HYPO 22GX1.5 SAFETY (NEEDLE) ×2 IMPLANT
NS IRRIG 1000ML POUR BTL (IV SOLUTION) ×2 IMPLANT
PACK BASIC VI WITH GOWN DISP (CUSTOM PROCEDURE TRAY) ×3 IMPLANT
PAD DRESSING TELFA 3X8 NADH (GAUZE/BANDAGES/DRESSINGS) IMPLANT
SURGILUBE 2OZ TUBE FLIPTOP (MISCELLANEOUS) ×2 IMPLANT
SUT CHROMIC 3 0 PS 2 (SUTURE) ×4 IMPLANT
SUT CHROMIC 3 0 SH 27 (SUTURE) ×2 IMPLANT
SUT SILK 2 0 (SUTURE)
SUT SILK 2-0 18XBRD TIE 12 (SUTURE) IMPLANT
SUT VIC AB 3-0 SH 27 (SUTURE) ×6
SUT VIC AB 3-0 SH 27X BRD (SUTURE) IMPLANT
SYR 10ML LL (SYRINGE) ×2 IMPLANT
SYR CONTROL 10ML LL (SYRINGE) ×4 IMPLANT
WATER STERILE IRR 1000ML POUR (IV SOLUTION) IMPLANT
WATER STERILE IRR 500ML POUR (IV SOLUTION) ×2 IMPLANT
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
## 2018-09-14 NOTE — ED Notes (Signed)
Pt ready for transport to OR>

## 2018-09-14 NOTE — Anesthesia Postprocedure Evaluation (Signed)
Anesthesia Post Note  Patient: Mark Mendez  Procedure(s) Performed: REPAIR OF FRACTURED PENIS (N/A Penis)     Patient location during evaluation: PACU Anesthesia Type: General Level of consciousness: awake and alert Pain management: pain level controlled Vital Signs Assessment: post-procedure vital signs reviewed and stable Respiratory status: spontaneous breathing, nonlabored ventilation, respiratory function stable and patient connected to nasal cannula oxygen Cardiovascular status: blood pressure returned to baseline and stable Postop Assessment: no apparent nausea or vomiting Anesthetic complications: no    Last Vitals:  Vitals:   09/14/18 1530 09/14/18 1545  BP:  (!) 143/102  Pulse:    Resp:    Temp: 36.7 C 36.7 C  SpO2:      Last Pain:  Vitals:   09/14/18 1545  TempSrc:   PainSc: 0-No pain                 Barnet Glasgow

## 2018-09-14 NOTE — Anesthesia Procedure Notes (Addendum)
Procedure Name: Intubation Date/Time: 09/14/2018 12:44 PM Performed by: Lavina Hamman, CRNA Pre-anesthesia Checklist: Patient identified, Emergency Drugs available, Suction available, Patient being monitored and Timeout performed Patient Re-evaluated:Patient Re-evaluated prior to induction Oxygen Delivery Method: Circle system utilized Preoxygenation: Pre-oxygenation with 100% oxygen Induction Type: IV induction, Rapid sequence and Cricoid Pressure applied Laryngoscope Size: Mac and 4 Grade View: Grade I Tube type: Oral Tube size: 7.5 mm Number of attempts: 1 Airway Equipment and Method: Stylet Placement Confirmation: ETT inserted through vocal cords under direct vision,  positive ETCO2,  CO2 detector and breath sounds checked- equal and bilateral Secured at: 23 cm Tube secured with: Tape Dental Injury: Teeth and Oropharynx as per pre-operative assessment  Comments: ATOI

## 2018-09-14 NOTE — Progress Notes (Signed)
Pt being escorted by wheelchair to private vehicle, pt again asked where his pain medicine to go home was... informed patient that MD called in / electronically sent prescription to his pharmacy at [facility] on [address]. Pt was moe focused on leaving, eating and making phone calls rather than listening to DC instructions written out and provided by MD and Anesthesilogist.

## 2018-09-14 NOTE — ED Notes (Signed)
MRI Called In
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
## 2018-09-14 NOTE — Progress Notes (Signed)
PACU RN DISCHARGE NOTE: pt meets discharge criteria to be DC to home per MD and Anesthesiologist orders. DC instructions reviewed with patient and significant other. Pt teaching done re: (1) importance of handwashing (2) instructions reviewed about rx for ABX and to take as prescribed (3) discussed safety while taking PO pain med (4) post procedure/ operative diet secondary to general anesthesia. (5) times to call MD on call, I.e. pain not controlled, signs of infections, bleeding from surgical site. Pt is very inclined to return to work tomorrow, spoke with pt about anesthesia instructions include resting for 24 hours after having surgery. Gave rationale for these instructions as well. Pt teaching done regards to removing urinary catheter tomorrow am per MD orders. Instructed pt both verbally and demonstration on discontinuing urinary catheter. Pt was able to state back instructions. Provided leg bag for urinary catheter, instructed on how to empty bag and stressed again on importance of handwashing before and after. Also discussed importance of making and keeping post op surgical evaluation appointment. Also discussed per DC instructions about no sexual activity for six weeks per MD instructions. Several opportunities for questions provided. Pt escorted to exit via wheelchair. Prior to leaving, gave opportunity for questions or re-explaination. Pt declined

## 2018-09-14 NOTE — Transfer of Care (Signed)
Immediate Anesthesia Transfer of Care Note  Patient: Mark Mendez  Procedure(s) Performed: REPAIR OF FRACTURED PENIS (N/A Penis)  Patient Location: PACU  Anesthesia Type:General  Level of Consciousness: awake, alert , oriented and patient cooperative  Airway & Oxygen Therapy: Patient Spontanous Breathing and Patient connected to face mask oxygen  Post-op Assessment: Report given to RN, Post -op Vital signs reviewed and stable and Patient moving all extremities  Post vital signs: Reviewed and stable  Last Vitals:  Vitals Value Taken Time  BP 154/112 09/14/2018  2:33 PM  Temp    Pulse 94 09/14/2018  2:34 PM  Resp 19 09/14/2018  2:34 PM  SpO2 100 % 09/14/2018  2:34 PM  Vitals shown include unvalidated device data.  Last Pain:  Vitals:   09/14/18 1156  TempSrc: Oral  PainSc:          Complications: No apparent anesthesia complications

## 2018-09-14 NOTE — ED Notes (Signed)
Patient transported to MRI 

## 2018-09-14 NOTE — ED Triage Notes (Signed)
Pt states lower abdominal pain/upper groin pain since last night. Pt states that there is swelling in the area. Last BM yesterday. No issues with urination

## 2018-09-14 NOTE — Anesthesia Preprocedure Evaluation (Addendum)
Anesthesia Evaluation  Patient identified by MRN, date of birth, ID band Patient awake  General Assessment Comment:Pt last ate at 0900  Reviewed: Allergy & Precautions, H&P , NPO status , Patient's Chart, lab work & pertinent test results  Airway Mallampati: II  TM Distance: >3 FB Neck ROM: Full    Dental no notable dental hx. (+) Teeth Intact, Dental Advisory Given   Pulmonary asthma , Current Smoker,    Pulmonary exam normal breath sounds clear to auscultation       Cardiovascular Exercise Tolerance: Good hypertension, Normal cardiovascular exam Rhythm:Regular Rate:Normal  09/14/2018 EKG SR w LVH R 69   Neuro/Psych negative neurological ROS  negative psych ROS   GI/Hepatic Neg liver ROS,   Endo/Other  negative endocrine ROS  Renal/GU negative Renal ROS     Musculoskeletal negative musculoskeletal ROS (+)   Abdominal   Peds  Hematology negative hematology ROS (+) Hgb 11.1   Anesthesia Other Findings   Reproductive/Obstetrics                            Lab Results  Component Value Date   CREATININE 0.86 09/17/2016   BUN 11 09/17/2016   NA 134 (L) 09/17/2016   K 4.5 09/17/2016   CL 101 09/17/2016   CO2 24 09/17/2016    Lab Results  Component Value Date   WBC 9.7 09/17/2016   HGB 11.1 (L) 09/17/2016   HCT 34.7 (L) 09/17/2016   MCV 81.6 09/17/2016   PLT 409 (H) 09/17/2016    Anesthesia Physical Anesthesia Plan  ASA: III and emergent  Anesthesia Plan: General   Post-op Pain Management:    Induction: Cricoid pressure planned, Rapid sequence and Intravenous  PONV Risk Score and Plan: 2 and Treatment may vary due to age or medical condition, Dexamethasone and Ondansetron  Airway Management Planned: Oral ETT  Additional Equipment:   Intra-op Plan:   Post-operative Plan: Extubation in OR  Informed Consent: I have reviewed the patients History and Physical, chart, labs  and discussed the procedure including the risks, benefits and alternatives for the proposed anesthesia with the patient or authorized representative who has indicated his/her understanding and acceptance.   Dental advisory given  Plan Discussed with: CRNA  Anesthesia Plan Comments:         Anesthesia Quick Evaluation

## 2018-09-14 NOTE — Discharge Instructions (Signed)
Penile incision and repair of penile tear (corpora tear), Care After This sheet gives you information about how to care for yourself after your procedure. Your doctor may also give you more specific instructions. If you have problems or questions, contact your doctor. Follow these instructions at home: Cut care   Follow instructions from your doctor about how to take care of your cut from surgery (incision). Make sure you: ? Wash your hands with soap and water before you change your bandage (dressing). If you cannot use soap and water, use hand sanitizer. ? Change your bandage as told by your doctor. ? Leave stitches (sutures), skin glue, or skin tape (adhesive) strips in place. They may need to stay in place for 2 weeks or longer. If tape strips get loose and curl up, you may trim the loose edges. Do not remove tape strips completely unless your doctor says it is okay  Check your cut area every day for signs of infection. Check for: ? More redness, swelling, or pain. ? More fluid or blood. ? Warmth. ? Pus or a bad smell. Bathing  Do not take baths, swim, or use a hot tub until your doctor says it is okay. Ask your doctor if you can take showers. You may only be allowed to take sponge baths for bathing.  Do not get your cut area wet during the 24 hours after the procedure, or as long as told by your doctor.  You may shower tomorrow, Sep 15, 2018. Gently pat the cut dry. General instructions  Avoid heavy lifting, contact sports, biking, or swimming for 6 weeks.  Do not have sex or sexual activity for 6 weeks   Take or apply over-the-counter and prescription medicines only as told by your doctor.  Keep all follow-up visits as told by your doctor. This is important. Contact a doctor if:  Medicine does not help your pain.  You have more redness, swelling, or pain around your cut.  You have more fluid or blood coming from your cut.  Your cut feels warm to the touch.  You have pus  or a bad smell coming from your cut.  You have a fever. Get help right away if:  You cannot pee (urinate).  It hurts to pee.  Your pain is not helped by medicines.  There is redness, swelling, and soreness that spreads up the shaft of your penis, your thighs, or your lower belly (abdomen).  You have bleeding that does not stop when you press on it. Summary  Follow instructions from your doctor about how to take care of your cut from surgery (incision).  Check your cut area every day for signs of infection.  Do not have sex until your doctor says it is okay. This information is not intended to replace advice given to you by your health care provider. Make sure you discuss any questions you have with your health care provider. * Remove urinary catheter tomorrow morning, Jan 13th, 2020 - if unable to remove catheter or having difficulty, you may contact [facility] off and they can assist  You  *per your Surgeon today, he has instructed to have [place] (6) WEEKS  *keep the dressing on your penis until instructed to remove it, keep the dressing clean and dry at all times  *remember to wash your hands before and after going to the restroom

## 2018-09-15 ENCOUNTER — Encounter (HOSPITAL_COMMUNITY): Payer: Self-pay | Admitting: Urology

## 2019-03-30 ENCOUNTER — Ambulatory Visit (INDEPENDENT_AMBULATORY_CARE_PROVIDER_SITE_OTHER): Payer: BC Managed Care – PPO

## 2019-03-30 ENCOUNTER — Encounter (HOSPITAL_COMMUNITY): Payer: Self-pay | Admitting: Family Medicine

## 2019-03-30 ENCOUNTER — Ambulatory Visit (HOSPITAL_COMMUNITY)
Admission: EM | Admit: 2019-03-30 | Discharge: 2019-03-30 | Disposition: A | Discharge status: Home / Self Care | Payer: BC Managed Care – PPO | Attending: Family Medicine | Admitting: Family Medicine

## 2019-03-30 ENCOUNTER — Other Ambulatory Visit: Payer: Self-pay

## 2019-03-30 DIAGNOSIS — M1990 Unspecified osteoarthritis, unspecified site: Secondary | ICD-10-CM

## 2019-03-30 DIAGNOSIS — I10 Essential (primary) hypertension: Secondary | ICD-10-CM

## 2019-03-30 MED ORDER — PREDNISONE 10 MG (21) PO TBPK: ORAL_TABLET | ORAL | 0 refills | Status: DC

## 2019-03-30 MED ORDER — AMLODIPINE BESYLATE 5 MG PO TABS: 5.0000 mg | ORAL_TABLET | Freq: Every day | ORAL | 1 refills | Status: DC

## 2019-03-30 NOTE — Discharge Instructions (Addendum)
X-ray revealed some arthritis as we suspected. We will treat this with prednisone taper over the next 6 days.  Make sure you take this with food. If your symptoms continue or worsen you need to follow-up with orthopedic specialist

## 2019-03-30 NOTE — ED Triage Notes (Signed)
Pt. States he have right shoulder pain for over 3 weeks, he denies any accident.
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
## 2019-08-23 ENCOUNTER — Encounter (HOSPITAL_COMMUNITY): Payer: Self-pay

## 2019-08-23 ENCOUNTER — Ambulatory Visit (HOSPITAL_COMMUNITY)
Admission: EM | Admit: 2019-08-23 | Discharge: 2019-08-23 | Disposition: A | Discharge status: Home / Self Care | Payer: BC Managed Care – PPO | Attending: Family Medicine | Admitting: Family Medicine

## 2019-08-23 ENCOUNTER — Other Ambulatory Visit: Payer: Self-pay

## 2019-08-23 DIAGNOSIS — I10 Essential (primary) hypertension: Secondary | ICD-10-CM

## 2019-08-23 DIAGNOSIS — Z760 Encounter for issue of repeat prescription: Secondary | ICD-10-CM

## 2019-08-23 MED ORDER — AMLODIPINE BESYLATE 5 MG PO TABS: 5.0000 mg | ORAL_TABLET | Freq: Every day | ORAL | 0 refills | Status: DC

## 2019-08-23 NOTE — ED Triage Notes (Signed)
Pt states she has been light headed due his blood pressure. Pt states the time he took his meds were a week ago. Pt states he needs a work note to return back to work.

## 2019-08-23 NOTE — ED Notes (Signed)
154/112 bp reported to [employer]
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
## 2019-08-23 NOTE — Discharge Instructions (Signed)
Call to set up an appointment with a PCP

## 2019-09-02 ENCOUNTER — Ambulatory Visit (HOSPITAL_COMMUNITY)
Admission: EM | Admit: 2019-09-02 | Discharge: 2019-09-02 | Disposition: A | Discharge status: Home / Self Care | Payer: BC Managed Care – PPO | Attending: Family Medicine | Admitting: Family Medicine

## 2019-09-02 ENCOUNTER — Other Ambulatory Visit: Payer: Self-pay

## 2019-09-02 ENCOUNTER — Encounter (HOSPITAL_COMMUNITY): Payer: Self-pay

## 2019-09-02 DIAGNOSIS — L84 Corns and callosities: Secondary | ICD-10-CM | POA: Diagnosis not present

## 2019-09-02 NOTE — ED Triage Notes (Signed)
Pt present callus on his left foot that is very painful. Pt states he soaked his foot in epsom salt with warm water.

## 2019-09-02 NOTE — Discharge Instructions (Signed)
Continue soaking feet in warm water with Epson salt Dry well and apply a thicker cream to feet especially around callused areas-may try Vaseline, Eucerin or Aquaphor You may also consider trying Okeefes healthy feet Consider changing work boots or getting inserts for more support  Please follow-up if developing increased pain, redness or swelling to any part of your foot If symptoms persisting I recommend following up with primary care or podiatry
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
## 2019-10-04 ENCOUNTER — Other Ambulatory Visit: Payer: Self-pay

## 2019-10-04 ENCOUNTER — Ambulatory Visit (HOSPITAL_COMMUNITY)
Admission: EM | Admit: 2019-10-04 | Discharge: 2019-10-04 | Disposition: A | Discharge status: Home / Self Care | Payer: BC Managed Care – PPO | Attending: Family Medicine | Admitting: Family Medicine

## 2019-10-04 ENCOUNTER — Encounter (HOSPITAL_COMMUNITY): Payer: Self-pay

## 2019-10-04 DIAGNOSIS — Z79899 Other long term (current) drug therapy: Secondary | ICD-10-CM | POA: Insufficient documentation

## 2019-10-04 DIAGNOSIS — U071 COVID-19: Secondary | ICD-10-CM | POA: Insufficient documentation

## 2019-10-04 DIAGNOSIS — R0989 Other specified symptoms and signs involving the circulatory and respiratory systems: Secondary | ICD-10-CM | POA: Diagnosis present

## 2019-10-04 DIAGNOSIS — I10 Essential (primary) hypertension: Secondary | ICD-10-CM | POA: Diagnosis not present

## 2019-10-04 DIAGNOSIS — J9801 Acute bronchospasm: Secondary | ICD-10-CM

## 2019-10-04 DIAGNOSIS — B349 Viral infection, unspecified: Secondary | ICD-10-CM | POA: Diagnosis not present

## 2019-10-04 DIAGNOSIS — F17290 Nicotine dependence, other tobacco product, uncomplicated: Secondary | ICD-10-CM | POA: Diagnosis not present

## 2019-10-04 MED ORDER — ALBUTEROL SULFATE HFA 108 (90 BASE) MCG/ACT IN AERS
2.0000 | INHALATION_SPRAY | Freq: Once | RESPIRATORY_TRACT | Status: AC
  Administered 2019-10-04: 2 via RESPIRATORY_TRACT

## 2019-10-04 MED ORDER — ALBUTEROL SULFATE HFA 108 (90 BASE) MCG/ACT IN AERS
INHALATION_SPRAY | RESPIRATORY_TRACT | Status: AC
  Filled 2019-10-04: qty 6.7

## 2019-10-04 MED ORDER — ALBUTEROL SULFATE HFA 108 (90 BASE) MCG/ACT IN AERS: 1.0000 | INHALATION_SPRAY | Freq: Four times a day (QID) | RESPIRATORY_TRACT | 0 refills | Status: DC | PRN

## 2019-10-04 MED ORDER — PREDNISONE 20 MG PO TABS: 20.0000 mg | ORAL_TABLET | Freq: Two times a day (BID) | ORAL | 0 refills | Status: DC

## 2019-10-04 NOTE — ED Triage Notes (Signed)
Pt state he has congestion for over a week now. Pt state he has been wheezing mostly at night.
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
## 2019-10-04 NOTE — Discharge Instructions (Addendum)
Go home to rest Drink plenty of fluids Take Tylenol for pain or fever You may take over-the-counter cough and cold medicines as needed Take the prednisone 2 x a day for 5 days Use the inhaler as needed wheezing You must quarantine at home until your COVID test result is available You can check for your test result in MyChart

## 2019-10-05 LAB — NOVEL CORONAVIRUS, NAA (HOSP ORDER, SEND-OUT TO REF LAB; TAT 18-24 HRS): SARS-CoV-2, NAA: DETECTED — AB

## 2019-10-06 ENCOUNTER — Telehealth (HOSPITAL_COMMUNITY): Payer: Self-pay | Admitting: Nurse Practitioner

## 2019-10-06 NOTE — Telephone Encounter (Signed)
Called to discuss with Mark Mendez about Covid symptoms and the use of bamlanivimab, a monoclonal antibody infusion for those with mild to moderate Covid symptoms and at a high risk of hospitalization.     Pt does not qualify for infusion therapy as his symptoms first presented > 10 days prior to timing of infusion. Symptoms tier reviewed as well as criteria for ending isolation. Preventative practices reviewed. Patient verbalized understanding.   Patient Active Problem List   Diagnosis Date Noted  . Left leg pain 11/22/2016   Beckey Rutter, DNP, AGNP-C [facility]  [email or web address] [facility] Main Line: [id number] ([place])

## 2019-11-08 ENCOUNTER — Ambulatory Visit (HOSPITAL_COMMUNITY)
Admission: EM | Admit: 2019-11-08 | Discharge: 2019-11-08 | Disposition: A | Discharge status: Home / Self Care | Payer: BC Managed Care – PPO | Attending: Family Medicine | Admitting: Family Medicine

## 2019-11-08 ENCOUNTER — Encounter (HOSPITAL_COMMUNITY): Payer: Self-pay

## 2019-11-08 ENCOUNTER — Other Ambulatory Visit: Payer: Self-pay

## 2019-11-08 DIAGNOSIS — R112 Nausea with vomiting, unspecified: Secondary | ICD-10-CM

## 2019-11-08 DIAGNOSIS — K529 Noninfective gastroenteritis and colitis, unspecified: Secondary | ICD-10-CM

## 2019-11-08 MED ORDER — ONDANSETRON HCL 4 MG PO TABS: 4.0000 mg | ORAL_TABLET | Freq: Four times a day (QID) | ORAL | 0 refills | Status: DC

## 2019-11-08 MED ORDER — ONDANSETRON 4 MG PO TBDP
4.0000 mg | ORAL_TABLET | Freq: Once | ORAL | Status: AC
  Administered 2019-11-08: 4 mg via ORAL

## 2019-11-08 MED ORDER — ONDANSETRON 4 MG PO TBDP
ORAL_TABLET | ORAL | Status: AC
  Filled 2019-11-08: qty 1

## 2019-11-08 MED ORDER — OMEPRAZOLE 20 MG PO CPDR: 20.0000 mg | DELAYED_RELEASE_CAPSULE | Freq: Every day | ORAL | 0 refills | Status: DC

## 2019-11-08 NOTE — ED Triage Notes (Signed)
Pt c/o generalized abd pain, n/v/d onset approx 1900 last night; states last meal at restaurant at approx 1400 yesterday. Pt reports others who accompanied him to restaurant did not have any n/v/d.  Denies, sore throat, HA, cough, body aches, fever, chills. Tested positive for COVID on Jan 31.
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
## 2019-11-08 NOTE — Discharge Instructions (Addendum)
Your stomach is irritated, likely from something you ate.   Go to the ER if you are experiencing severe pain, increased nausea and vomiting and have concerns of dehydration, shortness of breath, or other concerning symptoms.

## 2019-12-11 ENCOUNTER — Institutional Professional Consult (permissible substitution): Payer: Self-pay | Admitting: Medical

## 2019-12-14 ENCOUNTER — Telehealth: Payer: Self-pay | Admitting: General Practice

## 2019-12-14 NOTE — Telephone Encounter (Signed)
Fine with me

## 2019-12-14 NOTE — Telephone Encounter (Signed)
Pt called and was scheduled for new Pt appt on 4-9 and thought that it was supposed to be for the 19th instead. He wants to see if he could reschedule?

## 2019-12-15 ENCOUNTER — Ambulatory Visit (HOSPITAL_COMMUNITY)
Admission: EM | Admit: 2019-12-15 | Discharge: 2019-12-15 | Disposition: A | Discharge status: Home / Self Care | Payer: BC Managed Care – PPO | Attending: Family Medicine | Admitting: Family Medicine

## 2019-12-15 ENCOUNTER — Other Ambulatory Visit: Payer: Self-pay

## 2019-12-15 ENCOUNTER — Encounter (HOSPITAL_COMMUNITY): Payer: Self-pay

## 2019-12-15 DIAGNOSIS — R1030 Lower abdominal pain, unspecified: Secondary | ICD-10-CM

## 2019-12-15 DIAGNOSIS — R197 Diarrhea, unspecified: Secondary | ICD-10-CM | POA: Diagnosis not present

## 2019-12-15 MED ORDER — DICYCLOMINE HCL 20 MG PO TABS: 20.0000 mg | ORAL_TABLET | Freq: Three times a day (TID) | ORAL | 0 refills | Status: DC

## 2019-12-15 NOTE — Discharge Instructions (Signed)
May try using Bentyl before meals and bedtime to help with abdominal cramping May use some Imodium or Pepto-Bismol to help slow bowel movements Patient to drink plenty of fluids to replace the fluids that you were losing Avoid spicy and greasy food temporarily  Please follow-up if any symptoms not improving or worsening, developing fevers, increased abdominal pain, blood in the stool or vomiting

## 2019-12-15 NOTE — ED Triage Notes (Signed)
Pt states he has diarrhea and stomach cramps x 2 days.
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
## 2020-01-08 ENCOUNTER — Encounter: Payer: Self-pay | Admitting: Nurse Practitioner

## 2020-01-08 ENCOUNTER — Other Ambulatory Visit: Payer: Self-pay

## 2020-01-08 ENCOUNTER — Ambulatory Visit: Payer: BC Managed Care – PPO | Admitting: Medical

## 2020-01-08 ENCOUNTER — Encounter: Payer: Self-pay | Admitting: Medical

## 2020-01-08 VITALS — BP 122/88 | HR 103 | Temp 98.7°F | Ht 67.0 in | Wt 139.4 lb

## 2020-01-08 DIAGNOSIS — K921 Melena: Secondary | ICD-10-CM

## 2020-01-08 DIAGNOSIS — Z1211 Encounter for screening for malignant neoplasm of colon: Secondary | ICD-10-CM | POA: Diagnosis not present

## 2020-01-08 DIAGNOSIS — F17200 Nicotine dependence, unspecified, uncomplicated: Secondary | ICD-10-CM

## 2020-01-08 DIAGNOSIS — R0989 Other specified symptoms and signs involving the circulatory and respiratory systems: Secondary | ICD-10-CM

## 2020-01-08 DIAGNOSIS — I10 Essential (primary) hypertension: Secondary | ICD-10-CM

## 2020-01-08 DIAGNOSIS — K649 Unspecified hemorrhoids: Secondary | ICD-10-CM

## 2020-01-08 DIAGNOSIS — E785 Hyperlipidemia, unspecified: Secondary | ICD-10-CM

## 2020-01-08 MED ORDER — ALBUTEROL SULFATE HFA 108 (90 BASE) MCG/ACT IN AERS
2.0000 | INHALATION_SPRAY | Freq: Four times a day (QID) | RESPIRATORY_TRACT | 0 refills | Status: DC | PRN
Start: 2020-01-08 — End: 2021-07-30

## 2020-01-08 MED ORDER — HYDROCORTISONE 2.5 % EX CREA: TOPICAL_CREAM | Freq: Two times a day (BID) | CUTANEOUS | 1 refills | Status: DC

## 2020-01-08 MED ORDER — HYDROCORTISONE ACETATE 25 MG RE SUPP
25.0000 mg | Freq: Two times a day (BID) | RECTAL | 0 refills | Status: DC
Start: 2020-01-08 — End: 2020-07-03

## 2020-01-08 NOTE — Patient Instructions (Signed)
We are referring you to the gastroenterologist for blood in the stool, screen for colon cancer and further discussion hemorrhoids  We are checking some labs today including your cholesterol  I recommend you quit smoking  Begin albuterol 2 puffs 3 times a day for the next few days to see if that gives you any change in your breathing.  Lets have you return soon for a physical to do a breathing test, additional labs and screenings, and general exam   Hemorrhoids:  I prescribed Hydrocortisone 2.5% cream today.  You can apply this topically to the external hemorrhoid twice daily short term for 3-5 days at a time as needed for itching, irritations, or swelling  I prescribed Hydrocortisone suppository (Anusol or Proctosol brand) today. You can use this twice daily per rectum short term for 3-5 days at a time for hemorrhoid flare up including itching, irritations, swelling or discomfort  Please review the information below  If not improving in the next few days or worsening, then call or recheck.  Medication costs:  If you get to the pharmacy and medication prescribed today was either too expensive, not covered by your insurance, or required prior authorization, then please call us back to let us know.  We often have no way to know if a medication is too expensive or not covered by your insurance.  Thanks for your cooperation.   Hemorrhoids Hemorrhoids are swollen veins in and around the rectum or anus. There are two types of hemorrhoids:  Internal hemorrhoids. These occur in the veins that are just inside the rectum. They may poke through to the outside and become irritated and painful.  External hemorrhoids. These occur in the veins that are outside of the anus and can be felt as a painful swelling or hard lump near the anus.  Most hemorrhoids do not cause serious problems, and they can be managed with home treatments such as diet and lifestyle changes. If home treatments do not help your  symptoms, procedures can be done to shrink or remove the hemorrhoids. What are the causes? This condition is caused by increased pressure in the anal area. This pressure may result from various things, including:  Constipation.  Straining to have a bowel movement.  Diarrhea.  Pregnancy.  Obesity.  Sitting for long periods of time.  Heavy lifting or other activity that causes you to strain.  Anal sex.  What are the signs or symptoms? Symptoms of this condition include:  Pain.  Anal itching or irritation.  Rectal bleeding.  Leakage of stool (feces).  Anal swelling.  One or more lumps around the anus.  How is this diagnosed? This condition can often be diagnosed through a visual exam. Other exams or tests may also be done, such as:  Examination of the rectal area with a gloved hand (digital rectal exam).  Examination of the anal canal using a small tube (anoscope).  A blood test, if you have lost a significant amount of blood.  A test to look inside the colon (sigmoidoscopy or colonoscopy).  How is this treated? This condition can usually be treated at home. However, various procedures may be done if dietary changes, lifestyle changes, and other home treatments do not help your symptoms. These procedures can help make the hemorrhoids smaller or remove them completely. Some of these procedures involve surgery, and others do not. Common procedures include:  Rubber band ligation. Rubber bands are placed at the base of the hemorrhoids to cut off the blood supply  to them.  Sclerotherapy. Medicine is injected into the hemorrhoids to shrink them.  Infrared coagulation. A type of light energy is used to get rid of the hemorrhoids.  Hemorrhoidectomy surgery. The hemorrhoids are surgically removed, and the veins that supply them are tied off.  Stapled hemorrhoidopexy surgery. A circular stapling device is used to remove the hemorrhoids and use staples to cut off the blood  supply to them.  Follow these instructions at home: Eating and drinking  Eat foods that have a lot of fiber in them, such as whole grains, beans, nuts, fruits, and vegetables. Ask your health care provider about taking products that have added fiber (fiber supplements).  Drink enough fluid to keep your urine clear or pale yellow. Managing pain and swelling  Take warm sitz baths for 20 minutes, 3-4 times a day to ease pain and discomfort.  If directed, apply ice to the affected area. Using ice packs between sitz baths may be helpful. ? Put ice in a plastic bag. ? Place a towel between your skin and the bag. ? Leave the ice on for 20 minutes, 2-3 times a day. General instructions  Take over-the-counter and prescription medicines only as told by your health care provider.  Use medicated creams or suppositories as told.  Exercise regularly.  Go to the bathroom when you have the urge to have a bowel movement. Do not wait.  Avoid straining to have bowel movements.  Keep the anal area dry and clean. Use wet toilet paper or moist towelettes after a bowel movement.  Do not sit on the toilet for long periods of time. This increases blood pooling and pain. Contact a health care provider if:  You have increasing pain and swelling that are not controlled by treatment or medicine.  You have uncontrolled bleeding.  You have difficulty having a bowel movement, or you are unable to have a bowel movement.  You have pain or inflammation outside the area of the hemorrhoids. This information is not intended to replace advice given to you by your health care provider. Make sure you discuss any questions you have with your health care provider. Document Released: 08/17/2000 Document Revised: 01/18/2016 Document Reviewed: 05/04/2015 Elsevier Interactive Patient Education  [employer].
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
## 2020-01-09 LAB — COMPREHENSIVE METABOLIC PANEL
ALT: 28 IU/L (ref 0–44)
AST: 77 IU/L — ABNORMAL HIGH (ref 0–40)
Albumin/Globulin Ratio: 1.3 (ref 1.2–2.2)
Albumin: 4.1 g/dL (ref 3.8–4.9)
Alkaline Phosphatase: 85 IU/L (ref 39–117)
BUN/Creatinine Ratio: 11 (ref 9–20)
BUN: 10 mg/dL (ref 6–24)
Bilirubin Total: 0.2 mg/dL (ref 0.0–1.2)
CO2: 21 mmol/L (ref 20–29)
Calcium: 9.6 mg/dL (ref 8.7–10.2)
Chloride: 108 mmol/L — ABNORMAL HIGH (ref 96–106)
Creatinine, Ser: 0.95 mg/dL (ref 0.76–1.27)
GFR calc Af Amer: 105 mL/min/{1.73_m2} (ref >59)
GFR calc non Af Amer: 91 mL/min/{1.73_m2} (ref >59)
Globulin, Total: 3.2 g/dL (ref 1.5–4.5)
Glucose: 94 mg/dL (ref 65–99)
Potassium: 5.2 mmol/L (ref 3.5–5.2)
Sodium: 145 mmol/L — ABNORMAL HIGH (ref 134–144)
Total Protein: 7.3 g/dL (ref 6.0–8.5)

## 2020-01-09 LAB — LIPID PANEL
Chol/HDL Ratio: 3.4 ratio (ref 0.0–5.0)
Cholesterol, Total: 169 mg/dL (ref 100–199)
HDL: 50 mg/dL (ref >39)
LDL Chol Calc (NIH): 87 mg/dL (ref 0–99)
Triglycerides: 189 mg/dL — ABNORMAL HIGH (ref 0–149)
VLDL Cholesterol Cal: 32 mg/dL (ref 5–40)

## 2020-01-09 LAB — CBC WITH DIFFERENTIAL/PLATELET
Basophils Absolute: 0.1 10*3/uL (ref 0.0–0.2)
Basos: 1 %
EOS (ABSOLUTE): 0.2 10*3/uL (ref 0.0–0.4)
Eos: 4 %
Hematocrit: 42 % (ref 37.5–51.0)
Hemoglobin: 13.8 g/dL (ref 13.0–17.7)
Immature Grans (Abs): 0 10*3/uL (ref 0.0–0.1)
Immature Granulocytes: 0 %
Lymphocytes Absolute: 1.8 10*3/uL (ref 0.7–3.1)
Lymphs: 45 %
MCH: 29.7 pg (ref 26.6–33.0)
MCHC: 32.9 g/dL (ref 31.5–35.7)
MCV: 91 fL (ref 79–97)
Monocytes Absolute: 0.5 10*3/uL (ref 0.1–0.9)
Monocytes: 12 %
Neutrophils Absolute: 1.5 10*3/uL (ref 1.4–7.0)
Neutrophils: 38 %
Platelets: 446 10*3/uL (ref 150–450)
RBC: 4.64 x10E6/uL (ref 4.14–5.80)
RDW: 15.2 % (ref 11.6–15.4)
WBC: 3.9 10*3/uL (ref 3.4–10.8)

## 2020-01-18 ENCOUNTER — Ambulatory Visit
Admission: EM | Admit: 2020-01-18 | Discharge: 2020-01-18 | Disposition: A | Discharge status: Home / Self Care | Payer: BC Managed Care – PPO

## 2020-01-18 ENCOUNTER — Other Ambulatory Visit: Payer: Self-pay

## 2020-01-18 ENCOUNTER — Encounter: Payer: Self-pay | Admitting: Emergency Medicine

## 2020-01-18 DIAGNOSIS — M79602 Pain in left arm: Secondary | ICD-10-CM | POA: Diagnosis not present

## 2020-01-18 DIAGNOSIS — T881XXA Other complications following immunization, not elsewhere classified, initial encounter: Secondary | ICD-10-CM | POA: Diagnosis not present

## 2020-01-18 LAB — HEPATITIS C ANTIBODY: Hep C Virus Ab: <0.1 s/co ratio (ref 0.0–0.9)

## 2020-01-18 LAB — HEPATITIS B SURFACE ANTIGEN: Hepatitis B Surface Ag: NEGATIVE

## 2020-01-18 LAB — HEPATITIS B SURFACE ANTIBODY,QUALITATIVE: Hep B Surface Ab, Qual: NONREACTIVE

## 2020-01-18 LAB — HEPATITIS B CORE ANTIBODY, IGM: Hep B C IgM: NEGATIVE

## 2020-01-18 LAB — SPECIMEN STATUS REPORT

## 2020-01-18 NOTE — ED Triage Notes (Signed)
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  Today he has arm pain tired, weak

## 2020-01-18 NOTE — Discharge Instructions (Signed)
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
## 2020-01-29 ENCOUNTER — Encounter: Payer: Self-pay | Admitting: Nurse Practitioner

## 2020-01-29 ENCOUNTER — Ambulatory Visit (INDEPENDENT_AMBULATORY_CARE_PROVIDER_SITE_OTHER): Payer: BC Managed Care – PPO | Admitting: Nurse Practitioner

## 2020-01-29 VITALS — BP 130/82 | HR 87 | Ht 67.0 in | Wt 140.0 lb

## 2020-01-29 DIAGNOSIS — K625 Hemorrhage of anus and rectum: Secondary | ICD-10-CM

## 2020-01-29 DIAGNOSIS — R7989 Other specified abnormal findings of blood chemistry: Secondary | ICD-10-CM

## 2020-01-29 MED ORDER — CLENPIQ 10-3.5-12 MG-GM -GM/160ML PO SOLN
1.0000 | ORAL | 0 refills | Status: DC
Start: 2020-01-29 — End: 2020-02-26

## 2020-01-29 NOTE — Patient Instructions (Signed)
If you are age 54, your body mass index should be between 23-30. Your Body mass index is 21.93 kg/m. If this is out of the aforementioned range listed, please consider follow up with your Primary Care Provider.  If you are age 54 or younger, your body mass index should be between 19-25. Your Body mass index is 21.93 kg/m. If this is out of the aformentioned range listed, please consider follow up with your Primary Care Provider.   Reduce alcohol intake by half with continued reduction. Repeat hepatic panel in 2 months. Follow up in 3 months. Use Desitin as instructed.  We have sent the following medications to your pharmacy for you to pick up at your convenience: Clenpiq for prep  Due to recent changes in healthcare laws, you may see the results of your imaging and laboratory studies on MyChart before your provider has had a chance to review them.  We understand that in some cases there may be results that are confusing or concerning to you. Not all laboratory results come back in the same time frame and the provider may be waiting for multiple results in order to interpret others.  Please give [place] 48 hours in order for your provider to thoroughly review all the results before contacting the office for clarification of your results.    Thank you for choosing [facility] Noralyn Pick, CRNP

## 2020-01-29 NOTE — Progress Notes (Signed)
Reviewed and agree with management plan. CKS advised [place] that this new patient's colonoscopy is scheduled with Dr. Lyndel Safe and that he is assuming the primary GI physician role for this new patient due to availability.   Pricilla Riffle. Fuller Plan, MD [facility]
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
## 2020-02-26 ENCOUNTER — Other Ambulatory Visit: Payer: Self-pay

## 2020-02-26 ENCOUNTER — Ambulatory Visit (AMBULATORY_SURGERY_CENTER): Payer: BC Managed Care – PPO | Admitting: Gastroenterology

## 2020-02-26 ENCOUNTER — Other Ambulatory Visit: Payer: Self-pay | Admitting: Gastroenterology

## 2020-02-26 ENCOUNTER — Encounter: Payer: Self-pay | Admitting: Gastroenterology

## 2020-02-26 VITALS — BP 154/97 | HR 70 | Temp 98.0°F | Resp 18 | Ht 67.0 in | Wt 140.0 lb

## 2020-02-26 DIAGNOSIS — K5730 Diverticulosis of large intestine without perforation or abscess without bleeding: Secondary | ICD-10-CM

## 2020-02-26 DIAGNOSIS — D124 Benign neoplasm of descending colon: Secondary | ICD-10-CM

## 2020-02-26 DIAGNOSIS — D120 Benign neoplasm of cecum: Secondary | ICD-10-CM

## 2020-02-26 DIAGNOSIS — D122 Benign neoplasm of ascending colon: Secondary | ICD-10-CM

## 2020-02-26 DIAGNOSIS — K649 Unspecified hemorrhoids: Secondary | ICD-10-CM

## 2020-02-26 DIAGNOSIS — K625 Hemorrhage of anus and rectum: Secondary | ICD-10-CM | POA: Diagnosis present

## 2020-02-26 DIAGNOSIS — D123 Benign neoplasm of transverse colon: Secondary | ICD-10-CM | POA: Diagnosis not present

## 2020-02-26 HISTORY — PX: COLONOSCOPY: SHX5424

## 2020-02-26 MED ORDER — SODIUM CHLORIDE 0.9 % IV SOLN: 500.0000 mL | Freq: Once | INTRAVENOUS | Status: DC

## 2020-02-26 NOTE — Patient Instructions (Signed)
YOU HAD AN ENDOSCOPIC PROCEDURE TODAY AT THE [facility]:   Refer to the procedure report that was given to you for any specific questions about what was found during the examination.  If the procedure report does not answer your questions, please call your gastroenterologist to clarify.  If you requested that your care partner not be given the details of your procedure findings, then the procedure report has been included in a sealed envelope for you to review at your convenience later.  YOU SHOULD EXPECT: Some feelings of bloating in the abdomen. Passage of more gas than usual.  Walking can help get rid of the air that was put into your GI tract during the procedure and reduce the bloating. If you had a lower endoscopy (such as a colonoscopy or flexible sigmoidoscopy) you may notice spotting of blood in your stool or on the toilet paper. If you underwent a bowel prep for your procedure, you may not have a normal bowel movement for a few days.  Please Note:  You might notice some irritation and congestion in your nose or some drainage.  This is from the oxygen used during your procedure.  There is no need for concern and it should clear up in a day or so.  SYMPTOMS TO REPORT IMMEDIATELY:   Following lower endoscopy (colonoscopy or flexible sigmoidoscopy):  Excessive amounts of blood in the stool  Significant tenderness or worsening of abdominal pains  Swelling of the abdomen that is new, acute  Fever of 100F or higher  For urgent or emergent issues, a gastroenterologist can be reached at any hour by calling [phone]. Do not use MyChart messaging for urgent concerns.    DIET:  We do recommend a small meal at first, but then you may proceed to your regular diet.  Drink plenty of fluids but you should avoid alcoholic beverages for 24 hours.  ACTIVITY:  You should plan to take it easy for the rest of today and you should NOT DRIVE or use heavy machinery until tomorrow (because  of the sedation medicines used during the test).    FOLLOW UP: Our staff will call the number listed on your records 48-72 hours following your procedure to check on you and address any questions or concerns that you may have regarding the information given to you following your procedure. If we do not reach you, we will leave a message.  We will attempt to reach you two times.  During this call, we will ask if you have developed any symptoms of COVID 19. If you develop any symptoms (ie: fever, flu-like symptoms, shortness of breath, cough etc.) before then, please call [phone].  If you test positive for Covid 19 in the 2 weeks post procedure, please call and report this information to us.    If any biopsies were taken you will be contacted by phone or by letter within the next 1-3 weeks.  Please call us at [phone] if you have not heard about the biopsies in 3 weeks.    SIGNATURES/CONFIDENTIALITY: You and/or your care partner have signed paperwork which will be entered into your electronic medical record.  These signatures attest to the fact that that the information above on your After Visit Summary has been reviewed and is understood.  Full responsibility of the confidentiality of this discharge information lies with you and/or your care-partner. 

## 2020-02-26 NOTE — Progress Notes (Signed)
Report to PACU, RN, vss, BBS= Clear.  

## 2020-02-26 NOTE — Op Note (Signed)
[place] Patient Name: Mark Mendez Procedure Date: 02/26/2020 2:06 PM MRN: [id number] Endoscopist: Jackquline Denmark , MD Age: 54 Referring MD:  Date of Birth: March 15, 1966 Gender: Male Account #: [id number] Procedure:                Colonoscopy Indications:              Rectal bleeding Medicines:                Monitored Anesthesia Care Procedure:                Pre-Anesthesia Assessment:                           - Prior to the procedure, a History and Physical                            was performed, and patient medications and                            allergies were reviewed. The patient's tolerance of                            previous anesthesia was also reviewed. The risks                            and benefits of the procedure and the sedation                            options and risks were discussed with the patient.                            All questions were answered, and informed consent                            was obtained. Prior Anticoagulants: The patient has                            taken no previous anticoagulant or antiplatelet                            agents. ASA Grade Assessment: II - A patient with                            mild systemic disease. After reviewing the risks                            and benefits, the patient was deemed in                            satisfactory condition to undergo the procedure.                           After obtaining informed consent, the colonoscope  was passed under direct vision. Throughout the                            procedure, the patient's blood pressure, pulse, and                            oxygen saturations were monitored continuously. The                            Colonoscope was introduced through the anus and                            advanced to the the cecum, identified by                            appendiceal orifice and ileocecal valve. The                             colonoscopy was performed without difficulty. The                            patient tolerated the procedure well. The quality                            of the bowel preparation was good. The ileocecal                            valve, appendiceal orifice, and rectum were                            photographed. Scope In: 2:13:07 PM Scope Out: 2:33:01 PM Scope Withdrawal Time: 0 hours 15 minutes 36 seconds  Total Procedure Duration: 0 hours 19 minutes 54 seconds  Findings:                 Six sessile polyps were found in the proximal                            transverse colon, ascending colon and cecum. The                            polyps were 4 to 8 mm in size. These polyps were                            removed with a cold snare. Resection and retrieval                            were complete. Estimated blood loss: none.                           A 10 mm polyp was found in the mid descending                            colon. The polyp was semi-pedunculated.  The polyp                            was removed with a hot snare. Resection and                            retrieval were complete. Estimated blood loss: none.                           A few small-mouthed diverticula were found in the                            sigmoid colon.                           Non-bleeding internal hemorrhoids were found during                            retroflexion. The hemorrhoids were moderate.                           The exam was otherwise without abnormality on                            direct and retroflexion views. Complications:            No immediate complications. Estimated Blood Loss:     Estimated blood loss: none. Impression:               - Six 4 to 8 mm polyps in the proximal transverse                            colon, in the ascending colon and in the cecum,                            removed with a cold snare. Resected and retrieved.                           - One  10 mm polyp in the mid descending colon,                            removed with a hot snare. Resected and retrieved.                           - Diverticulosis in the sigmoid colon.                           - Non-bleeding internal hemorrhoids.                           - The examination was otherwise normal on direct                            and retroflexion views. Recommendation:           - Patient has a contact number  available for                            emergencies. The signs and symptoms of potential                            delayed complications were discussed with the                            patient. Return to normal activities tomorrow.                            Written discharge instructions were provided to the                            patient.                           - High fiber diet. If hard stools, start Benefiber                            1 tablespoon p.o. QPM with 8 ounces of water.                           - Continue present medications.                           - Await pathology results.                           - Repeat colonoscopy for surveillance based on                            pathology results.                           - No aspirin, ibuprofen, naproxen, or other                            non-steroidal anti-inflammatory drugs for 5 days                            after polyp removal.                           - If any further rectal bleeding, use Preparation H                            1 twice daily after the bowel movement for 7 to 10                            days.                           -Tried calling [place] x 3. Her cell phone went on  answering machine. The mailbox was full. Hence,                            could not leave a message.Jackquline Denmark, MD 02/26/2020 2:42:23 PM This report has been signed electronically.

## 2020-02-26 NOTE — Progress Notes (Signed)
Called to room to assist during endoscopic procedure.  Patient ID and intended procedure confirmed with present staff. Received instructions for my participation in the procedure from the performing physician.  

## 2020-03-01 ENCOUNTER — Telehealth: Payer: Self-pay

## 2020-03-01 NOTE — Telephone Encounter (Signed)
  Follow up Call-  Call back number 02/26/2020  Post procedure Call Back phone  # [phone]  Permission to leave phone message Yes  Some recent data might be hidden     Patient questions:  Do you have a fever, pain , or abdominal swelling? Yes.   Pain Score  0 *  Have you tolerated food without any problems? Yes.    Have you been able to return to your normal activities? Yes.    Do you have any questions about your discharge instructions: Diet   No. Medications  No. Follow up visit  No.  Do you have questions or concerns about your Care? No.  Actions: * If pain score is 4 or above: No action needed, pain <4.  1. Have you developed a fever since your procedure? no  2.   Have you had an respiratory symptoms (SOB or cough) since your procedure? no  3.   Have you tested positive for COVID 19 since your procedure no  4.   Have you had any family members/close contacts diagnosed with the COVID 19 since your procedure?  no   If yes to any of these questions please route to Joylene Dmario, RN and Erenest Rasher, RN

## 2020-03-11 ENCOUNTER — Encounter: Payer: Self-pay | Admitting: Medical

## 2020-03-11 ENCOUNTER — Other Ambulatory Visit: Payer: Self-pay

## 2020-03-11 ENCOUNTER — Ambulatory Visit: Payer: BC Managed Care – PPO | Admitting: Medical

## 2020-03-11 VITALS — BP 132/98 | HR 95 | Ht 67.0 in | Wt 142.6 lb

## 2020-03-11 DIAGNOSIS — E785 Hyperlipidemia, unspecified: Secondary | ICD-10-CM

## 2020-03-11 DIAGNOSIS — K921 Melena: Secondary | ICD-10-CM

## 2020-03-11 DIAGNOSIS — F17200 Nicotine dependence, unspecified, uncomplicated: Secondary | ICD-10-CM

## 2020-03-11 DIAGNOSIS — Z0000 Encounter for general adult medical examination without abnormal findings: Secondary | ICD-10-CM | POA: Diagnosis not present

## 2020-03-11 DIAGNOSIS — J4520 Mild intermittent asthma, uncomplicated: Secondary | ICD-10-CM | POA: Insufficient documentation

## 2020-03-11 DIAGNOSIS — Z789 Other specified health status: Secondary | ICD-10-CM

## 2020-03-11 DIAGNOSIS — K649 Unspecified hemorrhoids: Secondary | ICD-10-CM | POA: Diagnosis not present

## 2020-03-11 DIAGNOSIS — I10 Essential (primary) hypertension: Secondary | ICD-10-CM | POA: Diagnosis not present

## 2020-03-11 DIAGNOSIS — Z7185 Encounter for immunization safety counseling: Secondary | ICD-10-CM

## 2020-03-11 DIAGNOSIS — Z125 Encounter for screening for malignant neoplasm of prostate: Secondary | ICD-10-CM | POA: Insufficient documentation

## 2020-03-11 DIAGNOSIS — Z136 Encounter for screening for cardiovascular disorders: Secondary | ICD-10-CM

## 2020-03-11 DIAGNOSIS — R7989 Other specified abnormal findings of blood chemistry: Secondary | ICD-10-CM

## 2020-03-11 DIAGNOSIS — Z7189 Other specified counseling: Secondary | ICD-10-CM

## 2020-03-11 MED ORDER — AMLODIPINE BESYLATE 10 MG PO TABS
10.0000 mg | ORAL_TABLET | Freq: Every day | ORAL | 3 refills | Status: DC
Start: 2020-03-11 — End: 2021-12-05

## 2020-03-11 NOTE — Patient Instructions (Signed)
Vaccine recommendations: You are due for several vaccines.  I recommend you check insurance coverage for these vaccines. I recommend you come up with a schedule to come in and start getting some these vaccines accomplished.  You are up-to-date on tetanus vaccine done in 2018  You are up-to-date on Covid vaccine  I recommend a yearly flu shot in the fall  These are the vaccines you need:  Pneumonia shot, pneumococcal 23 vaccine x1  Shingrix shingles vaccine, 2 shots given 2 months apart  Hepatitis B vaccine, 2 shots given 1 month apart     Cancer screens: You just had your colonoscopy  We are doing a prostate blood test today  Check your skin for skin lesions regularly     We will call with lab results    High blood pressure: Increase amlodipine to 10 mg daily   High cholesterol  I recommend you take a cholesterol medication daily at bedtime to lower cholesterol and lower heart disease risk but we are still waiting to see which her liver test look like.    Elevated liver test  Likely due to alcohol  Please cut back on alcohol intake  We are rechecking liver today  Follow-up with gastroenterology to check your ultrasound of your liver   I strongly recommend you quit tobacco     Consider the following diet information/sample:  I recommend you to eat 3 meals a day   Breakfast You may eat 1 of the following  Omelette, which can include a small amount of cheese, and vegetables such as peppers, mushrooms, small pieces of [place] or chicken  Low sugar yogurt serving which can include some fruit such as berries  Egg whites or hard boiled egg and meat (1-2 strips of bacon, or small piece of [place] sausage or [place] bacon)   Mid-morning snack 1 fruit serving such as one of the following:  medium-sized apple  medium-sized orange,  Tangerine  1/2 banana   3/4 cup of fresh berries or frozen berries  A protein source such as one of the  following:  8 almonds   small handful of walnuts or other nuts  small piece of cheese,  low sugar yogurt   Lunch A protein source such as 1 of the following: . 1 serving of beans such as black beans, pinto beans, green beans, or edamame (soy beans) . 1 meat serving such as 6 oz or deck of card size serving of fish, skinless chicken, or [place], either grilled or baked preferably.   You can use some pork or beef, but limit this compared to fish, chicken or [place] Vegetable - Half of your plate should be a non-starchy vegetables!  So avoid white potatoes and corn.  Otherwise, eat a large portion of vegetables. . Avocado, cucumber, tomato, carrots, greens, lettuce, squash, okra, etc.  . Vegetables can include salad with olive oil/vinaigrette dressing   Mid-afternoon snack 1 fruit serving such as one of the following:  medium-sized apple  medium-sized orange,  Tangerine  1/2 banana   3/4 cup of fresh berries or frozen berries  A protein source such as one of the following:  8 almonds   small handful of walnuts or other nuts  small piece of cheese,  low sugar yogurt   Dinner A protein source such as 1 of the following: . 1 serving of beans such as black beans, pinto beans, green beans, or edamame (soy beans) . 1 meat serving such as 6 oz or deck  of card size serving of fish, skinless chicken, or [place], either grilled or baked preferably.   You can use some pork or beef, but limit this compared to fish, chicken or [place] Vegetable - Half of your plate should be a non-starchy vegetables!  So avoid white potatoes and corn.  Otherwise, eat a large portion of vegetables. . Avocado, cucumber, tomato, carrots, greens, lettuce, squash, okra, etc.  . Vegetables can include salad with olive oil/vinaigrette dressing   Beverages: Water Unsweet tea Home made juice with a juicer without sugar added other than small bit of honey or agave nectar Water with sugar free flavor such  as Mio   AVOID.... For the time being I want you to cut out the following items completely: . Soda, sweet tea, juice, beer or wine or alcohol . ALL grains and breads including rice, pasta, bread, cereal . Sweets such as cake, candy, pies, chips, cookies, chocolate
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
## 2020-03-12 LAB — HIV ANTIBODY (ROUTINE TESTING W REFLEX): HIV Screen 4th Generation wRfx: NONREACTIVE

## 2020-03-12 LAB — PSA: Prostate Specific Ag, Serum: 1.4 ng/mL (ref 0.0–4.0)

## 2020-03-12 LAB — HEPATIC FUNCTION PANEL
ALT: 26 IU/L (ref 0–44)
AST: 50 IU/L — ABNORMAL HIGH (ref 0–40)
Albumin: 4 g/dL (ref 3.8–4.9)
Alkaline Phosphatase: 89 IU/L (ref 48–121)
Bilirubin Total: <0.2 mg/dL (ref 0.0–1.2)
Bilirubin, Direct: 0.08 mg/dL (ref 0.00–0.40)
Total Protein: 6.9 g/dL (ref 6.0–8.5)

## 2020-03-15 ENCOUNTER — Encounter: Payer: Self-pay | Admitting: Gastroenterology

## 2020-05-02 ENCOUNTER — Ambulatory Visit
Admission: EM | Admit: 2020-05-02 | Discharge: 2020-05-02 | Disposition: A | Discharge status: Home / Self Care | Payer: BC Managed Care – PPO

## 2020-05-02 DIAGNOSIS — R197 Diarrhea, unspecified: Secondary | ICD-10-CM | POA: Diagnosis not present

## 2020-05-02 DIAGNOSIS — R1031 Right lower quadrant pain: Secondary | ICD-10-CM | POA: Diagnosis not present

## 2020-05-02 NOTE — ED Triage Notes (Addendum)
Pt states had diarrhea x3 and lower abdominal cramping after eating last night. States will need a work note.
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
## 2020-05-02 NOTE — Discharge Instructions (Signed)
Monitor for improvement in diarrhea/pain over the next few days  Start with clear liquids, then move to plain foods like bananas, rice, applesauce, toast, broth, grits, oatmeal. As those food settle okay you may transition to your normal foods. Avoid spicy and greasy foods as much as possible.  For Diarrhea: This is your body's natural way of getting rid of a virus. You may try taking 1 imodium to decrease amount of stools a day, but we do not want you to stop your diarrhea.   Preventing dehydration is key! You need to replace the fluid your body is expelling. Drink plenty of fluids, may use Pedialyte or sports drinks.   Please return if you are experiencing blood in your vomit or stool or experiencing dizziness, lightheadedness, extreme fatigue, increased abdominal pain.

## 2020-05-03 ENCOUNTER — Other Ambulatory Visit: Payer: Self-pay

## 2020-05-03 ENCOUNTER — Ambulatory Visit
Admission: EM | Admit: 2020-05-03 | Discharge: 2020-05-03 | Disposition: A | Discharge status: Home / Self Care | Payer: BC Managed Care – PPO | Attending: Family Medicine | Admitting: Family Medicine

## 2020-05-03 DIAGNOSIS — R197 Diarrhea, unspecified: Secondary | ICD-10-CM | POA: Diagnosis not present

## 2020-05-03 MED ORDER — DIPHENOXYLATE-ATROPINE 2.5-0.025 MG PO TABS
2.0000 | ORAL_TABLET | Freq: Four times a day (QID) | ORAL | 0 refills | Status: DC | PRN
Start: 2020-05-03 — End: 2020-06-05

## 2020-05-03 MED ORDER — DIPHENOXYLATE-ATROPINE 2.5-0.025 MG PO TABS
2.0000 | ORAL_TABLET | Freq: Four times a day (QID) | ORAL | 0 refills | Status: DC | PRN
Start: 2020-05-03 — End: 2020-05-03
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
## 2020-05-03 NOTE — ED Triage Notes (Signed)
Patient states that he was seen here yesterday for diarrhea and abdominal pain. States that abdominal pain has stopped but diarrhea has been constant. States that he has been increasing his fluids as recommended.

## 2020-06-05 ENCOUNTER — Other Ambulatory Visit: Payer: Self-pay

## 2020-06-05 ENCOUNTER — Ambulatory Visit
Admission: EM | Admit: 2020-06-05 | Discharge: 2020-06-05 | Disposition: A | Discharge status: Home / Self Care | Payer: BC Managed Care – PPO | Attending: Family Medicine | Admitting: Family Medicine

## 2020-06-05 ENCOUNTER — Encounter: Payer: Self-pay | Admitting: Family Medicine

## 2020-06-05 DIAGNOSIS — R197 Diarrhea, unspecified: Secondary | ICD-10-CM | POA: Diagnosis not present

## 2020-06-05 MED ORDER — DIPHENOXYLATE-ATROPINE 2.5-0.025 MG PO TABS
2.0000 | ORAL_TABLET | Freq: Four times a day (QID) | ORAL | 0 refills | Status: DC | PRN
Start: 2020-06-05 — End: 2021-08-10
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
## 2020-06-05 NOTE — ED Triage Notes (Signed)
Pt states he went to a family reunion/cookout yesterday and has diarrhea since last night with no other symptoms or complaints. Pt is aox4 and ambulatory.

## 2020-07-03 ENCOUNTER — Other Ambulatory Visit: Payer: Self-pay

## 2020-07-03 ENCOUNTER — Ambulatory Visit
Admission: EM | Admit: 2020-07-03 | Discharge: 2020-07-03 | Disposition: A | Discharge status: Home / Self Care | Payer: BC Managed Care – PPO | Attending: Physician Assistant | Admitting: Physician Assistant

## 2020-07-03 DIAGNOSIS — R112 Nausea with vomiting, unspecified: Secondary | ICD-10-CM

## 2020-07-03 DIAGNOSIS — R1084 Generalized abdominal pain: Secondary | ICD-10-CM

## 2020-07-03 MED ORDER — ONDANSETRON 4 MG PO TBDP
4.0000 mg | ORAL_TABLET | Freq: Three times a day (TID) | ORAL | 0 refills | Status: DC | PRN
Start: 2020-07-03 — End: 2020-08-18

## 2020-07-03 NOTE — ED Triage Notes (Signed)
Pt states he ate some food that didn't agree with him and had a few emesis episodes last night and is nauseous and having abdominal pain this morning. Pt is aox4 and ambulatory.

## 2020-07-03 NOTE — Discharge Instructions (Signed)
Zofran for nausea and vomiting as needed. Keep hydrated, you urine should be clear to pale yellow in color. Bland diet, advance as tolerated. Monitor for any worsening of symptoms, nausea or vomiting not controlled by medication, worsening abdominal pain, fever, go to the emergency department for further evaluation needed.
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
## 2020-08-18 ENCOUNTER — Ambulatory Visit
Admission: EM | Admit: 2020-08-18 | Discharge: 2020-08-18 | Disposition: A | Discharge status: Home / Self Care | Payer: BC Managed Care – PPO

## 2020-08-18 ENCOUNTER — Other Ambulatory Visit: Payer: Self-pay

## 2020-08-18 DIAGNOSIS — R197 Diarrhea, unspecified: Secondary | ICD-10-CM

## 2020-08-18 NOTE — Discharge Instructions (Signed)
Drink plenty of fluids Use lomotil as needed Follow up if not improving or worsening

## 2020-08-18 NOTE — ED Triage Notes (Signed)
Patient states he had loose stool this am. Pt is aox4 and ambulatory.
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
## 2020-09-01 ENCOUNTER — Telehealth: Payer: Self-pay | Admitting: Medical

## 2020-09-01 NOTE — Telephone Encounter (Signed)
Dismissal letter in guarantor snapshot  °

## 2020-12-18 ENCOUNTER — Other Ambulatory Visit: Payer: Self-pay

## 2020-12-18 ENCOUNTER — Ambulatory Visit
Admission: EM | Admit: 2020-12-18 | Discharge: 2020-12-18 | Disposition: A | Discharge status: Home / Self Care | Payer: BC Managed Care – PPO | Attending: Emergency Medicine | Admitting: Emergency Medicine

## 2020-12-18 DIAGNOSIS — H00014 Hordeolum externum left upper eyelid: Secondary | ICD-10-CM | POA: Diagnosis not present

## 2020-12-18 DIAGNOSIS — J301 Allergic rhinitis due to pollen: Secondary | ICD-10-CM

## 2020-12-18 MED ORDER — CEPHALEXIN 500 MG PO CAPS: 500.0000 mg | ORAL_CAPSULE | Freq: Four times a day (QID) | ORAL | 0 refills | Status: AC

## 2020-12-18 MED ORDER — ERYTHROMYCIN 5 MG/GM OP OINT
TOPICAL_OINTMENT | OPHTHALMIC | 0 refills | Status: DC
Start: 2020-12-18 — End: 2021-05-01

## 2020-12-18 MED ORDER — CETIRIZINE HCL 10 MG PO CAPS
10.0000 mg | ORAL_CAPSULE | Freq: Every day | ORAL | 0 refills | Status: DC
Start: 2020-12-18 — End: 2021-08-10

## 2020-12-18 MED ORDER — FLUTICASONE PROPIONATE 50 MCG/ACT NA SUSP: 1.0000 | Freq: Every day | NASAL | 0 refills | Status: DC
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
## 2020-12-18 NOTE — Discharge Instructions (Signed)
Begin daily cetirizine and Flonase to help with sinus congestion and inflammation Use erythromycin ointment 4 times daily in left eye x1 week Warm compresses to help with swelling If over the next 48 hours you have any worsening redness pain or swelling to eyelids please fill prescription for Keflex If despite using the above having continued worsening pain redness swelling around eye or any vision changes, eye pain please return for reevaluation

## 2020-12-18 NOTE — ED Triage Notes (Signed)
Pt reports swelling to skin around eye started yesterday.

## 2021-02-19 ENCOUNTER — Other Ambulatory Visit: Payer: Self-pay

## 2021-02-19 ENCOUNTER — Ambulatory Visit
Admission: EM | Admit: 2021-02-19 | Discharge: 2021-02-19 | Disposition: A | Discharge status: Home / Self Care | Payer: BC Managed Care – PPO

## 2021-02-19 DIAGNOSIS — M7712 Lateral epicondylitis, left elbow: Secondary | ICD-10-CM | POA: Diagnosis not present
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
## 2021-02-19 NOTE — ED Triage Notes (Addendum)
Patient presents to Urgent Care with complaints of intermittent left arm pain x 3 weeks. 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 Pain increases when lifting heavy boxes.  Treating pain with tylenol and advil.

## 2021-02-19 NOTE — Discharge Instructions (Addendum)
Apply ice to affected area Advise rest and stretching Take ibuprofen as needed Recommend compression with elbow brace as discussed Follow up with orthopedics if no improvement

## 2021-02-28 ENCOUNTER — Other Ambulatory Visit: Payer: Self-pay

## 2021-02-28 ENCOUNTER — Ambulatory Visit
Admission: EM | Admit: 2021-02-28 | Discharge: 2021-02-28 | Disposition: A | Discharge status: Home / Self Care | Payer: BC Managed Care – PPO | Attending: Emergency Medicine | Admitting: Emergency Medicine

## 2021-02-28 ENCOUNTER — Encounter: Payer: Self-pay | Admitting: Emergency Medicine

## 2021-02-28 DIAGNOSIS — M25512 Pain in left shoulder: Secondary | ICD-10-CM | POA: Diagnosis not present
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
## 2021-02-28 NOTE — ED Triage Notes (Signed)
Pt here for recheck of left shoulder pain seen 6/19 for same; pt denies any improvement; pt sts lifts heavy things for work

## 2021-03-21 ENCOUNTER — Ambulatory Visit
Admission: EM | Admit: 2021-03-21 | Discharge: 2021-03-21 | Disposition: A | Discharge status: Home / Self Care | Payer: BC Managed Care – PPO | Attending: Emergency Medicine | Admitting: Emergency Medicine

## 2021-03-21 ENCOUNTER — Ambulatory Visit
Admission: EM | Admit: 2021-03-21 | Discharge: 2021-03-21 | Discharge status: Against Medical Advice | Payer: BC Managed Care – PPO

## 2021-03-21 ENCOUNTER — Other Ambulatory Visit: Payer: Self-pay

## 2021-03-21 DIAGNOSIS — M25512 Pain in left shoulder: Secondary | ICD-10-CM | POA: Diagnosis not present

## 2021-03-21 MED ORDER — PREDNISONE 10 MG PO TABS: ORAL_TABLET | ORAL | 0 refills | Status: DC

## 2021-03-21 MED ORDER — TIZANIDINE HCL 2 MG PO TABS: 2.0000 mg | ORAL_TABLET | Freq: Four times a day (QID) | ORAL | 0 refills | Status: DC | PRN

## 2021-03-21 NOTE — Discharge Instructions (Addendum)
Follow-up with sports medicine if pain persisting Begin prednisone taper x6 days-begin with 6 tablets on day 1, decrease by 1 tablet each day until complete-6, 5, 4, 3, 2, 1-take with food and earlier in the day if possible Supplement with tizanidine at home/bedtime, this is a muscle relaxer, may cause drowsiness, do not drive or work after taking Alternate ice and heat Continue to move shoulder, but avoid heavy lifting
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
## 2021-03-21 NOTE — ED Triage Notes (Signed)
Pt presents with complaints of left shoulder pain x 1 month. Pt reports he does a lot of heavy lifting at work during the day and then will bother him a lot at night. Denies relief with advil and tylenol.

## 2021-03-22 ENCOUNTER — Encounter: Payer: BC Managed Care – PPO | Admitting: Medical

## 2021-03-27 ENCOUNTER — Ambulatory Visit (INDEPENDENT_AMBULATORY_CARE_PROVIDER_SITE_OTHER): Payer: BC Managed Care – PPO | Admitting: Family Medicine

## 2021-03-27 ENCOUNTER — Other Ambulatory Visit: Payer: Self-pay

## 2021-03-27 ENCOUNTER — Ambulatory Visit: Payer: Self-pay

## 2021-03-27 VITALS — BP 128/88 | Ht 67.0 in | Wt 155.0 lb

## 2021-03-27 DIAGNOSIS — M25512 Pain in left shoulder: Secondary | ICD-10-CM | POA: Diagnosis not present

## 2021-03-27 MED ORDER — METHYLPREDNISOLONE ACETATE 40 MG/ML IJ SUSP
40.0000 mg | Freq: Once | INTRAMUSCULAR | Status: AC
  Administered 2021-03-27: 40 mg via INTRA_ARTICULAR

## 2021-03-27 MED ORDER — DICLOFENAC SODIUM 75 MG PO TBEC: 75.0000 mg | DELAYED_RELEASE_TABLET | Freq: Two times a day (BID) | ORAL | 1 refills | Status: DC

## 2021-03-27 NOTE — Patient Instructions (Signed)
You have rotator cuff impingement Try to avoid painful activities (overhead activities, lifting with extended arm) as much as possible. Voltaren '75mg'$  twice a day with food for pain and inflammation. Can take tylenol in addition to this. Subacromial injection may be beneficial to help with pain and to decrease inflammation. Start physical therapy with transition to home exercise program. Do home exercise program with theraband and scapular stabilization exercises daily 3 sets of 10 once a day. If not improving at follow-up we will consider further imaging, injection, physical therapy, and/or nitro patches. Follow up with me in 1 month. Light duty as written until I see you back.

## 2021-03-28 ENCOUNTER — Encounter: Payer: Self-pay | Admitting: Family Medicine
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
## 2021-04-07 ENCOUNTER — Ambulatory Visit: Payer: BC Managed Care – PPO | Admitting: Physical Therapy

## 2021-04-12 ENCOUNTER — Ambulatory Visit: Payer: BC Managed Care – PPO | Admitting: Physical Therapy

## 2021-04-28 ENCOUNTER — Ambulatory Visit (HOSPITAL_BASED_OUTPATIENT_CLINIC_OR_DEPARTMENT_OTHER): Payer: BC Managed Care – PPO | Admitting: Family Medicine

## 2021-05-01 ENCOUNTER — Ambulatory Visit (INDEPENDENT_AMBULATORY_CARE_PROVIDER_SITE_OTHER): Payer: BC Managed Care – PPO | Admitting: Family Medicine

## 2021-05-01 ENCOUNTER — Other Ambulatory Visit: Payer: Self-pay

## 2021-05-01 VITALS — Ht 67.0 in | Wt 155.0 lb

## 2021-05-01 DIAGNOSIS — M7542 Impingement syndrome of left shoulder: Secondary | ICD-10-CM | POA: Diagnosis not present
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
## 2021-05-01 NOTE — Patient Instructions (Signed)
It was wonderful to meet you today. Thank you for allowing me to be a part of your care. Below is a short summary of what we discussed at your visit today:  Left shoulder follow up - Do the home shoulder exercises we showed you today - Take the Voltaren tablet (generic name: diclofenac sodium) twice daily every day; this medicine should be waiting for you at your pharmacy.  If you have any difficulties with picking it up, please give [place] a call. - Take the Zanaflex tablet, a muscle relaxer (generic name: Tizanidine) every 6 hours as needed - Today we filled out the physician portion of your short-term disability claim form and placed a copy of it on file   If you have any questions or concerns, please do not hesitate to contact us via phone or MyChart message.   Ezequiel Essex, MD

## 2021-05-02 ENCOUNTER — Encounter: Payer: Self-pay | Admitting: Family Medicine

## 2021-05-22 ENCOUNTER — Encounter: Payer: Self-pay | Admitting: Gastroenterology

## 2021-06-07 ENCOUNTER — Ambulatory Visit (HOSPITAL_BASED_OUTPATIENT_CLINIC_OR_DEPARTMENT_OTHER): Payer: BC Managed Care – PPO | Admitting: Family Medicine

## 2021-07-07 ENCOUNTER — Ambulatory Visit (HOSPITAL_BASED_OUTPATIENT_CLINIC_OR_DEPARTMENT_OTHER): Payer: BC Managed Care – PPO | Admitting: Family Medicine

## 2021-07-11 ENCOUNTER — Ambulatory Visit
Admission: EM | Admit: 2021-07-11 | Discharge: 2021-07-11 | Disposition: A | Discharge status: Home / Self Care | Payer: BC Managed Care – PPO | Attending: Physician Assistant | Admitting: Physician Assistant

## 2021-07-11 ENCOUNTER — Other Ambulatory Visit: Payer: Self-pay

## 2021-07-11 DIAGNOSIS — R6889 Other general symptoms and signs: Secondary | ICD-10-CM

## 2021-07-11 MED ORDER — OSELTAMIVIR PHOSPHATE 75 MG PO CAPS: 75.0000 mg | ORAL_CAPSULE | Freq: Two times a day (BID) | ORAL | 0 refills | Status: DC
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
## 2021-07-11 NOTE — ED Triage Notes (Signed)
Pt c/o coughing, chest hurting, hoarse voice, legs hurt,   Denies headache, nausea, vomiting, diarrhea, constipation.   Onset today

## 2021-07-30 ENCOUNTER — Other Ambulatory Visit: Payer: Self-pay

## 2021-07-30 ENCOUNTER — Ambulatory Visit
Admission: EM | Admit: 2021-07-30 | Discharge: 2021-07-30 | Disposition: A | Discharge status: Home / Self Care | Payer: BC Managed Care – PPO | Attending: Physician Assistant | Admitting: Physician Assistant

## 2021-07-30 DIAGNOSIS — J209 Acute bronchitis, unspecified: Secondary | ICD-10-CM

## 2021-07-30 MED ORDER — PREDNISONE 20 MG PO TABS: 40.0000 mg | ORAL_TABLET | Freq: Every day | ORAL | 0 refills | Status: AC

## 2021-07-30 MED ORDER — ALBUTEROL SULFATE HFA 108 (90 BASE) MCG/ACT IN AERS: 2.0000 | INHALATION_SPRAY | Freq: Four times a day (QID) | RESPIRATORY_TRACT | 0 refills | Status: DC | PRN
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
## 2021-07-30 NOTE — ED Triage Notes (Signed)
Pt was last seen at [facility] on 11/8 for uri/ flu sxs. Today, Pt presents c/o lingering cough and wheezing. No meds taken.  Denies CP and Sob.

## 2021-08-07 ENCOUNTER — Encounter (HOSPITAL_BASED_OUTPATIENT_CLINIC_OR_DEPARTMENT_OTHER): Payer: Self-pay | Admitting: Family Medicine

## 2021-08-10 ENCOUNTER — Other Ambulatory Visit: Payer: Self-pay

## 2021-08-10 ENCOUNTER — Ambulatory Visit (INDEPENDENT_AMBULATORY_CARE_PROVIDER_SITE_OTHER): Payer: BC Managed Care – PPO | Admitting: Family Medicine

## 2021-08-10 ENCOUNTER — Encounter (HOSPITAL_BASED_OUTPATIENT_CLINIC_OR_DEPARTMENT_OTHER): Payer: Self-pay | Admitting: Family Medicine

## 2021-08-10 ENCOUNTER — Encounter (HOSPITAL_BASED_OUTPATIENT_CLINIC_OR_DEPARTMENT_OTHER): Payer: Self-pay

## 2021-08-10 VITALS — BP 120/82 | HR 67 | Ht 67.0 in | Wt 145.7 lb

## 2021-08-10 DIAGNOSIS — I10 Essential (primary) hypertension: Secondary | ICD-10-CM | POA: Diagnosis not present

## 2021-08-10 DIAGNOSIS — Z0000 Encounter for general adult medical examination without abnormal findings: Secondary | ICD-10-CM
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
## 2021-08-10 NOTE — Patient Instructions (Signed)
  Medication Instructions:  Your physician recommends that you continue on your current medications as directed. Please refer to the Current Medication list given to you today. --If you need a refill on any your medications before your next appointment, please call your pharmacy first. If no refills are authorized on file call the office.--  Follow-Up: Your next appointment:   Your physician recommends that you schedule a follow-up appointment in: 2-3 MONTHS for a CPE with Dr. de [place]  You will receive a text message or e-mail with a link to a survey about your care and experience with [place] today! We would greatly appreciate your feedback!   Thanks for letting us be apart of your health journey!!  Primary Care and Sports Medicine   Dr. Arlina Robes [place]   We encourage you to activate your patient portal called "MyChart".  Sign up information is provided on this After Visit Summary.  MyChart is used to connect with patients for Virtual Visits (Telemedicine).  Patients are able to view lab/test results, encounter notes, upcoming appointments, etc.  Non-urgent messages can be sent to your provider as well. To learn more about what you can do with MyChart, please visit --  [email or web address].

## 2021-10-01 ENCOUNTER — Other Ambulatory Visit: Payer: Self-pay

## 2021-10-01 ENCOUNTER — Ambulatory Visit
Admission: EM | Admit: 2021-10-01 | Discharge: 2021-10-01 | Disposition: A | Discharge status: Home / Self Care | Payer: BC Managed Care – PPO | Attending: Internal Medicine | Admitting: Internal Medicine

## 2021-10-01 DIAGNOSIS — M25512 Pain in left shoulder: Secondary | ICD-10-CM

## 2021-10-01 MED ORDER — PREDNISONE 20 MG PO TABS: 40.0000 mg | ORAL_TABLET | Freq: Every day | ORAL | 0 refills | Status: AC

## 2021-10-01 NOTE — Discharge Instructions (Signed)
You have been prescribed prednisone steroid to decrease inflammation.  Please also apply ice to area.  Follow-up with your orthopedist for further evaluation and management.

## 2021-10-01 NOTE — ED Triage Notes (Signed)
Pt c/o left shoulder pain. States a few months ago he had a "partial radiator cuff" tear that required "therapy" for it. States today the pain got worse while at work.
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
## 2021-11-08 ENCOUNTER — Encounter (HOSPITAL_BASED_OUTPATIENT_CLINIC_OR_DEPARTMENT_OTHER): Payer: BC Managed Care – PPO | Admitting: Family Medicine

## 2021-11-08 NOTE — Assessment & Plan Note (Signed)
Blood pressure at goal in office today ?Can continue with amlodipine 10 mg daily ?Recommend DASH diet, regular aerobic exercise ?Recommend tobacco cessation ?

## 2021-12-05 ENCOUNTER — Ambulatory Visit (INDEPENDENT_AMBULATORY_CARE_PROVIDER_SITE_OTHER): Payer: BC Managed Care – PPO | Admitting: Family Medicine

## 2021-12-05 ENCOUNTER — Encounter (HOSPITAL_BASED_OUTPATIENT_CLINIC_OR_DEPARTMENT_OTHER): Payer: Self-pay | Admitting: Family Medicine

## 2021-12-05 ENCOUNTER — Encounter (HOSPITAL_BASED_OUTPATIENT_CLINIC_OR_DEPARTMENT_OTHER): Payer: Self-pay

## 2021-12-05 VITALS — BP 128/88 | HR 86 | Temp 98.7°F | Ht 66.0 in | Wt 141.4 lb

## 2021-12-05 DIAGNOSIS — Z0000 Encounter for general adult medical examination without abnormal findings: Secondary | ICD-10-CM | POA: Diagnosis not present

## 2021-12-05 DIAGNOSIS — Z122 Encounter for screening for malignant neoplasm of respiratory organs: Secondary | ICD-10-CM

## 2021-12-05 DIAGNOSIS — Z23 Encounter for immunization: Secondary | ICD-10-CM

## 2021-12-05 MED ORDER — AMLODIPINE BESYLATE 10 MG PO TABS: 10.0000 mg | ORAL_TABLET | Freq: Every day | ORAL | 1 refills | Status: DC

## 2021-12-05 MED ORDER — SHINGRIX 50 MCG/0.5ML IM SUSR: 0.5000 mL | Freq: Once | INTRAMUSCULAR | 0 refills | Status: AC

## 2021-12-05 MED ORDER — ALBUTEROL SULFATE HFA 108 (90 BASE) MCG/ACT IN AERS
2.0000 | INHALATION_SPRAY | Freq: Four times a day (QID) | RESPIRATORY_TRACT | 0 refills | Status: DC | PRN
Start: 2021-12-05 — End: 2022-10-12

## 2021-12-05 MED ORDER — HYDROCORTISONE ACETATE 25 MG RE SUPP: 25.0000 mg | Freq: Two times a day (BID) | RECTAL | 2 refills | Status: DC | PRN

## 2021-12-05 NOTE — Patient Instructions (Signed)
?  Medication Instructions:  ?Your physician recommends that you continue on your current medications as directed. Please refer to the Current Medication list given to you today. ?--If you need a refill on any your medications before your next appointment, please call your pharmacy first. If no refills are authorized on file call the office.-- ?Lab Work: ?Your physician has recommended that you have lab work today: today ?If you have labs (blood work) drawn today and your tests are completely normal, you will receive your results via [facility] a phone call from our staff.  ?Please ensure you check your voicemail in the event that you authorized detailed messages to be left on a delegated number. If you have any lab test that is abnormal or we need to change your treatment, we will call you to review the results. ? ?Referrals/Procedures/Imaging: ?Ct Scan ? ?Follow-Up: ?Your next appointment:   ?Your physician recommends that you schedule a follow-up appointment in: 3 month follow up with Dr. Tennis Must [place] ? ?You will receive a text message or e-mail with a link to a survey about your care and experience with [place] today! We would greatly appreciate your feedback!  ? ?Thanks for letting us be apart of your health journey!!  ?Primary Care and Sports Medicine  ? ?Dr. Kyung Rudd de [place]  ? ?We encourage you to activate your patient portal called "MyChart".  Sign up information is provided on this After Visit Summary.  MyChart is used to connect with patients for Virtual Visits (Telemedicine).  Patients are able to view lab/test results, encounter notes, upcoming appointments, etc.  Non-urgent messages can be sent to your provider as well. To learn more about what you can do with MyChart, please visit --  [email or web address].    ?
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
## 2021-12-05 NOTE — Assessment & Plan Note (Addendum)
Routine HCM labs ordered. HCM reviewed/discussed. Anticipatory guidance regarding healthy weight, lifestyle and choices given. ?Recommend healthy diet.  Recommend approximately 150 minutes/week of moderate intensity exercise ?Recommend regular dental and vision exams ?Always use seatbelt/lap and shoulder restraints ?Recommend using smoke alarms and checking batteries at least twice a year ?Recommend using sunscreen when outside ?Patient had previous colonoscopy with GI, was supposed to follow-up with them last year for repeat colonoscopy, has not done so yet.  Provided patient with information for GI office for him to contact them and schedule follow-up ?Discussed recommendations for shingles vaccine.  Patient interested, prescription sent to pharmacy ?Discussed tetanus immunization recommendations, patient is up-to-date ?Discussed role of healthy lifestyle including appropriate dietary choices, regular aerobic and anaerobic physical activity ?Discussed recommendation for lung cancer screening, patient interested, order placed today ?Recommend smoking cessation, reducing alcohol consumption which has been discussed with him in the past by prior PCP ?

## 2021-12-06 ENCOUNTER — Other Ambulatory Visit (HOSPITAL_BASED_OUTPATIENT_CLINIC_OR_DEPARTMENT_OTHER): Payer: Self-pay | Admitting: Family Medicine

## 2021-12-06 DIAGNOSIS — R7989 Other specified abnormal findings of blood chemistry: Secondary | ICD-10-CM

## 2021-12-06 LAB — COMPREHENSIVE METABOLIC PANEL
ALT: 61 IU/L — ABNORMAL HIGH (ref 0–44)
AST: 114 IU/L — ABNORMAL HIGH (ref 0–40)
Albumin/Globulin Ratio: 1.2 (ref 1.2–2.2)
Albumin: 4.1 g/dL (ref 3.8–4.9)
Alkaline Phosphatase: 94 IU/L (ref 44–121)
BUN/Creatinine Ratio: 9 (ref 9–20)
BUN: 7 mg/dL (ref 6–24)
Bilirubin Total: 0.3 mg/dL (ref 0.0–1.2)
CO2: 22 mmol/L (ref 20–29)
Calcium: 9.2 mg/dL (ref 8.7–10.2)
Chloride: 106 mmol/L (ref 96–106)
Creatinine, Ser: 0.78 mg/dL (ref 0.76–1.27)
Globulin, Total: 3.5 g/dL (ref 1.5–4.5)
Glucose: 99 mg/dL (ref 70–99)
Potassium: 4.2 mmol/L (ref 3.5–5.2)
Sodium: 141 mmol/L (ref 134–144)
Total Protein: 7.6 g/dL (ref 6.0–8.5)
eGFR: 105 mL/min/{1.73_m2} (ref >59)

## 2021-12-06 LAB — LIPID PANEL
Chol/HDL Ratio: 3.6 ratio (ref 0.0–5.0)
Cholesterol, Total: 195 mg/dL (ref 100–199)
HDL: 54 mg/dL (ref >39)
LDL Chol Calc (NIH): 121 mg/dL — ABNORMAL HIGH (ref 0–99)
Triglycerides: 110 mg/dL (ref 0–149)
VLDL Cholesterol Cal: 20 mg/dL (ref 5–40)

## 2021-12-06 LAB — CBC WITH DIFFERENTIAL/PLATELET
Basophils Absolute: 0.1 10*3/uL (ref 0.0–0.2)
Basos: 1 %
EOS (ABSOLUTE): 0.1 10*3/uL (ref 0.0–0.4)
Eos: 3 %
Hematocrit: 37.4 % — ABNORMAL LOW (ref 37.5–51.0)
Hemoglobin: 11.9 g/dL — ABNORMAL LOW (ref 13.0–17.7)
Immature Grans (Abs): 0 10*3/uL (ref 0.0–0.1)
Immature Granulocytes: 0 %
Lymphocytes Absolute: 2 10*3/uL (ref 0.7–3.1)
Lymphs: 38 %
MCH: 27.1 pg (ref 26.6–33.0)
MCHC: 31.8 g/dL (ref 31.5–35.7)
MCV: 85 fL (ref 79–97)
Monocytes Absolute: 0.6 10*3/uL (ref 0.1–0.9)
Monocytes: 10 %
Neutrophils Absolute: 2.6 10*3/uL (ref 1.4–7.0)
Neutrophils: 48 %
Platelets: 409 10*3/uL (ref 150–450)
RBC: 4.39 x10E6/uL (ref 4.14–5.80)
RDW: 17.2 % — ABNORMAL HIGH (ref 11.6–15.4)
WBC: 5.4 10*3/uL (ref 3.4–10.8)

## 2021-12-06 LAB — HEMOGLOBIN A1C
Est. average glucose Bld gHb Est-mCnc: 117 mg/dL
Hgb A1c MFr Bld: 5.7 % — ABNORMAL HIGH (ref 4.8–5.6)

## 2021-12-15 ENCOUNTER — Inpatient Hospital Stay: Admission: RE | Admit: 2021-12-15 | Payer: BC Managed Care – PPO | Source: Ambulatory Visit

## 2022-01-05 ENCOUNTER — Other Ambulatory Visit: Payer: BC Managed Care – PPO

## 2022-02-04 ENCOUNTER — Ambulatory Visit
Admission: EM | Admit: 2022-02-04 | Discharge: 2022-02-04 | Disposition: A | Discharge status: Home / Self Care | Payer: BC Managed Care – PPO

## 2022-02-04 DIAGNOSIS — R1011 Right upper quadrant pain: Secondary | ICD-10-CM
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
## 2022-02-04 NOTE — Discharge Instructions (Signed)
Follow-up with primary care doctor at scheduled appointment.  Go to ER if pain persists or worsens.

## 2022-02-04 NOTE — ED Triage Notes (Signed)
Pt presents with generalized abdominal pain since last night

## 2022-03-07 ENCOUNTER — Ambulatory Visit
Admission: EM | Admit: 2022-03-07 | Discharge: 2022-03-07 | Disposition: A | Discharge status: Home / Self Care | Payer: BC Managed Care – PPO

## 2022-03-07 DIAGNOSIS — R1084 Generalized abdominal pain: Secondary | ICD-10-CM

## 2022-03-07 NOTE — Discharge Instructions (Signed)
Please follow-up tomorrow with your primary care provider regarding your intermittent abdominal pain and elevated liver enzymes.  Be sure you asked them about abdominal x-ray to evaluate your enlarged liver as well.  Thank you for visiting urgent care today.

## 2022-03-07 NOTE — ED Triage Notes (Signed)
Pt c/o abd pain that began this morning, the patient denies other symptoms. Home interventions: none
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
## 2022-03-08 ENCOUNTER — Encounter (HOSPITAL_BASED_OUTPATIENT_CLINIC_OR_DEPARTMENT_OTHER): Payer: Self-pay | Admitting: Family Medicine

## 2022-03-08 ENCOUNTER — Ambulatory Visit (INDEPENDENT_AMBULATORY_CARE_PROVIDER_SITE_OTHER): Payer: BC Managed Care – PPO | Admitting: Family Medicine

## 2022-03-08 VITALS — BP 164/108 | HR 97 | Ht 66.0 in | Wt 142.7 lb

## 2022-03-08 DIAGNOSIS — R109 Unspecified abdominal pain: Secondary | ICD-10-CM | POA: Diagnosis not present

## 2022-03-08 DIAGNOSIS — R7989 Other specified abnormal findings of blood chemistry: Secondary | ICD-10-CM

## 2022-03-08 DIAGNOSIS — I10 Essential (primary) hypertension: Secondary | ICD-10-CM

## 2022-03-08 MED ORDER — AMLODIPINE BESYLATE 10 MG PO TABS: 10.0000 mg | ORAL_TABLET | Freq: Every day | ORAL | 1 refills | Status: AC

## 2022-03-08 NOTE — Assessment & Plan Note (Signed)
Blood pressure not at goal today.  He reports being out of his medication for about 2 weeks and is requesting a refill of it today.  At prior office visits, blood pressure has been better controlled.  He reports that he is checking it intermittently at home and is not blood pressure readings have been at goal at home. Stressed importance of being on blood pressure medication regularly and if nearing the end of supply at home, to request refill through his pharmacy.  Given that prior readings have been better controlled when taking blood pressure medicine regularly, will allow for patient to continue with same regimen, refill sent to pharmacy Recommend intermittent monitoring, DASH diet

## 2022-03-08 NOTE — Progress Notes (Signed)
    Procedures performed today:    None.  Independent interpretation of notes and tests performed by another provider:   None.  Brief History, Exam, Impression, and Recommendations:    BP (!) 164/108   Pulse 97   Ht '5\' 6"'$  (1.676 m)   Wt 142 lb 11.2 oz (64.7 kg)   SpO2 96%   BMI 23.03 kg/m   Essential hypertension, benign Blood pressure not at goal today.  He reports being out of his medication for about 2 weeks and is requesting a refill of it today.  At prior office visits, blood pressure has been better controlled.  He reports that he is checking it intermittently at home and is not blood pressure readings have been at goal at home. Stressed importance of being on blood pressure medication regularly and if nearing the end of supply at home, to request refill through his pharmacy.  Given that prior readings have been better controlled when taking blood pressure medicine regularly, will allow for patient to continue with same regimen, refill sent to pharmacy Recommend intermittent monitoring, DASH diet  Elevated LFTs Noted previously with recommendations for further evaluation.  He has had prior laboratory testing to assess for hepatitis B and hepatitis C which were both negative.  He has not had prior imaging and an ultrasound was ordered previously in order to evaluate the right upper quadrant/liver.  It appears that this was arranged previously, however patient canceled the imaging appointment Discussed importance of proceeding with imaging appointment, order still active, provided with information for [facility] imaging so that patient may reach out to schedule imaging  Abdominal pain Recently seen in urgent care for abdominal pain after eating out.  Today, he indicates that pain is resolved.  Does not have any current concerns related to his abdominal pain. Patient was supposed to follow-up with gastroenterologist last year, however did not do so.  At last appointment with me, we  did provide him with contact information for his GI doctor to contact them to schedule follow-up.  Given recent issues with abdominal pain as well as elevated LFTs, again stressed importance of follow-up with GI provider, again provided contact information for their office so that patient may reach out to them and schedule appointment  Return in about 6 weeks (around 04/19/2022) for HTN, Elevated LFTs.   ___________________________________________ [name], MD, ABFM, [facility] [place]

## 2022-03-08 NOTE — Assessment & Plan Note (Signed)
Recently seen in urgent care for abdominal pain after eating out.  Today, he indicates that pain is resolved.  Does not have any current concerns related to his abdominal pain. Patient was supposed to follow-up with gastroenterologist last year, however did not do so.  At last appointment with me, we did provide him with contact information for his GI doctor to contact them to schedule follow-up.  Given recent issues with abdominal pain as well as elevated LFTs, again stressed importance of follow-up with GI provider, again provided contact information for their office so that patient may reach out to them and schedule appointment

## 2022-03-08 NOTE — Assessment & Plan Note (Signed)
Noted previously with recommendations for further evaluation.  He has had prior laboratory testing to assess for hepatitis B and hepatitis C which were both negative.  He has not had prior imaging and an ultrasound was ordered previously in order to evaluate the right upper quadrant/liver.  It appears that this was arranged previously, however patient canceled the imaging appointment Discussed importance of proceeding with imaging appointment, order still active, provided with information for [facility] imaging so that patient may reach out to schedule imaging

## 2022-03-08 NOTE — Patient Instructions (Signed)
  Medication Instructions:  Your physician recommends that you continue on your current medications as directed. Please refer to the Current Medication list given to you today. --If you need a refill on any your medications before your next appointment, please call your pharmacy first. If no refills are authorized on file call the office.-- Lab Work: Your physician has recommended that you have lab work today: No If you have labs (blood work) drawn today and your tests are completely normal, you will receive your results via [place] a phone call from our staff.  Please ensure you check your voicemail in the event that you authorized detailed messages to be left on a delegated number. If you have any lab test that is abnormal or we need to change your treatment, we will call you to review the results.  Referrals/Procedures/Imaging: No  Follow-Up: Your next appointment:   Your physician recommends that you schedule a follow-up appointment in 6 weeks with Dr. de [place].  You will receive a text message or e-mail with a link to a survey about your care and experience with [place] today! We would greatly appreciate your feedback!   Thanks for letting us be apart of your health journey!!  Primary Care and Sports Medicine   Dr. Arlina Robes [place]   We encourage you to activate your patient portal called "MyChart".  Sign up information is provided on this After Visit Summary.  MyChart is used to connect with patients for Virtual Visits (Telemedicine).  Patients are able to view lab/test results, encounter notes, upcoming appointments, etc.  Non-urgent messages can be sent to your provider as well. To learn more about what you can do with MyChart, please visit --  [email or web address].

## 2022-03-09 ENCOUNTER — Ambulatory Visit (HOSPITAL_BASED_OUTPATIENT_CLINIC_OR_DEPARTMENT_OTHER): Payer: BC Managed Care – PPO | Admitting: Family Medicine

## 2022-03-16 ENCOUNTER — Ambulatory Visit
Admission: RE | Admit: 2022-03-16 | Discharge: 2022-03-16 | Disposition: A | Discharge status: Home / Self Care | Payer: BC Managed Care – PPO | Source: Ambulatory Visit | Attending: Family Medicine | Admitting: Family Medicine

## 2022-03-16 DIAGNOSIS — R7989 Other specified abnormal findings of blood chemistry: Secondary | ICD-10-CM

## 2022-03-22 ENCOUNTER — Telehealth (HOSPITAL_BASED_OUTPATIENT_CLINIC_OR_DEPARTMENT_OTHER): Payer: Self-pay | Admitting: Family Medicine

## 2022-03-22 NOTE — Telephone Encounter (Signed)
Pt was called and I spoke with him about calling [facility] to get an appointment he was previously patient there before so Dr. De [place] said as new referral did not need to be placed. I gave him the telephone number to Gastroenterology and he said he was going to call and make an appointment to be seen.

## 2022-03-22 NOTE — Telephone Encounter (Signed)
Pt lvm for clerical and stated that he spoke to someone in clinical that gave him his results from his ultrasound. He stated that they were going to send him to a liver specialist. I do not see a referral for this the only thing in the patients chart is a CT CHEST LUNG CA SCREEN LOW DOSE W/O CM (Order [id number])  That was put in on 12/05/21. Please clarify on what we are wanting to send the pt to do. Let pt know that I will call him after with what is wanting to be done. Please advise.
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
## 2022-03-28 ENCOUNTER — Other Ambulatory Visit (INDEPENDENT_AMBULATORY_CARE_PROVIDER_SITE_OTHER): Payer: BC Managed Care – PPO

## 2022-03-28 ENCOUNTER — Encounter: Payer: Self-pay | Admitting: Nurse Practitioner

## 2022-03-28 ENCOUNTER — Ambulatory Visit (INDEPENDENT_AMBULATORY_CARE_PROVIDER_SITE_OTHER): Payer: BC Managed Care – PPO | Admitting: Nurse Practitioner

## 2022-03-28 VITALS — BP 130/92 | HR 100 | Ht 67.0 in | Wt 141.5 lb

## 2022-03-28 DIAGNOSIS — D649 Anemia, unspecified: Secondary | ICD-10-CM

## 2022-03-28 DIAGNOSIS — F1090 Alcohol use, unspecified, uncomplicated: Secondary | ICD-10-CM

## 2022-03-28 DIAGNOSIS — K769 Liver disease, unspecified: Secondary | ICD-10-CM | POA: Diagnosis not present

## 2022-03-28 DIAGNOSIS — R7989 Other specified abnormal findings of blood chemistry: Secondary | ICD-10-CM | POA: Diagnosis not present

## 2022-03-28 DIAGNOSIS — Z86010 Personal history of colonic polyps: Secondary | ICD-10-CM

## 2022-03-28 LAB — IBC + FERRITIN
Ferritin: 15.2 ng/mL — ABNORMAL LOW (ref 22.0–322.0)
Iron: 32 ug/dL — ABNORMAL LOW (ref 42–165)
Saturation Ratios: 6.5 % — ABNORMAL LOW (ref 20.0–50.0)
TIBC: 495.6 ug/dL — ABNORMAL HIGH (ref 250.0–450.0)
Transferrin: 354 mg/dL (ref 212.0–360.0)

## 2022-03-28 LAB — CBC WITH DIFFERENTIAL/PLATELET
Basophils Absolute: 0.1 10*3/uL (ref 0.0–0.1)
Basophils Relative: 1.3 % (ref 0.0–3.0)
Eosinophils Absolute: 0.4 10*3/uL (ref 0.0–0.7)
Eosinophils Relative: 8 % — ABNORMAL HIGH (ref 0.0–5.0)
HCT: 35.6 % — ABNORMAL LOW (ref 39.0–52.0)
Hemoglobin: 11.4 g/dL — ABNORMAL LOW (ref 13.0–17.0)
Lymphocytes Relative: 17.2 % (ref 12.0–46.0)
Lymphs Abs: 0.9 10*3/uL (ref 0.7–4.0)
MCHC: 32.2 g/dL (ref 30.0–36.0)
MCV: 81.8 fl (ref 78.0–100.0)
Monocytes Absolute: 0.5 10*3/uL (ref 0.1–1.0)
Monocytes Relative: 10.4 % (ref 3.0–12.0)
Neutro Abs: 3.3 10*3/uL (ref 1.4–7.7)
Neutrophils Relative %: 63.1 % (ref 43.0–77.0)
Platelets: 451 10*3/uL — ABNORMAL HIGH (ref 150.0–400.0)
RBC: 4.35 Mil/uL (ref 4.22–5.81)
RDW: 20.1 % — ABNORMAL HIGH (ref 11.5–15.5)
WBC: 5.2 10*3/uL (ref 4.0–10.5)

## 2022-03-28 LAB — HEPATIC FUNCTION PANEL
ALT: 36 U/L (ref 0–53)
AST: 74 U/L — ABNORMAL HIGH (ref 0–37)
Albumin: 4.2 g/dL (ref 3.5–5.2)
Alkaline Phosphatase: 69 U/L (ref 39–117)
Bilirubin, Direct: 0.1 mg/dL (ref 0.0–0.3)
Total Bilirubin: 0.3 mg/dL (ref 0.2–1.2)
Total Protein: 7.9 g/dL (ref 6.0–8.3)

## 2022-03-28 LAB — BASIC METABOLIC PANEL
BUN: 7 mg/dL (ref 6–23)
CO2: 27 mEq/L (ref 19–32)
Calcium: 9.2 mg/dL (ref 8.4–10.5)
Chloride: 104 mEq/L (ref 96–112)
Creatinine, Ser: 0.77 mg/dL (ref 0.40–1.50)
GFR: 100.75 mL/min (ref >60.00)
Glucose, Bld: 93 mg/dL (ref 70–99)
Potassium: 4.2 mEq/L (ref 3.5–5.1)
Sodium: 136 mEq/L (ref 135–145)

## 2022-03-28 LAB — PROTIME-INR
INR: 0.9 ratio (ref 0.8–1.0)
Prothrombin Time: 9.9 s (ref 9.6–13.1)

## 2022-03-28 LAB — FOLATE: Folate: 18 ng/mL (ref >5.9)

## 2022-03-28 LAB — VITAMIN B12: Vitamin B-12: 530 pg/mL (ref 211–911)

## 2022-03-28 MED ORDER — PLENVU 140 G PO SOLR: 140.0000 g | ORAL | 0 refills | Status: DC

## 2022-03-28 NOTE — Patient Instructions (Signed)
If you are age 56 or older, your body mass index should be between 23-30. Your Body mass index is 22.16 kg/m. If this is out of the aforementioned range listed, please consider follow up with your Primary Care Provider.  If you are age 42 or younger, your body mass index should be between 19-25. Your Body mass index is 22.16 kg/m. If this is out of the aformentioned range listed, please consider follow up with your Primary Care Provider.   ________________________________________________________  The [facility] providers would like to encourage you to use [facility] to communicate with providers for non-urgent requests or questions.  Due to long hold times on the telephone, sending your provider a message by [employer] may be a faster and more efficient way to get a response.  Please allow 48 business hours for a response.  Please remember that this is for non-urgent requests.  _______________________________________________________  Reduce alcohol use, goal to be no alcohol intake.   You have been scheduled for an endoscopy and colonoscopy. Please follow the written instructions given to you at your visit today. Please pick up your prep supplies at the pharmacy within the next 1-3 days. If you use inhalers (even only as needed), please bring them with you on the day of your procedure.  You have been scheduled for an MRI at [facility] on [facility]. Your appointment time is 12/noon. Please arrive to admitting (at main entrance of the hospital) 30 minutes prior to your appointment time for registration purposes. Please make certain not to have anything to eat or drink 6 hours prior to your test. In addition, if you have any metal in your body, have a pacemaker or defibrillator, please be sure to let your ordering physician know. This test typically takes 45 minutes to 1 hour to complete. Should you need to reschedule, please call [phone] to do so.  Your provider has requested that you go to  the basement level for lab work before leaving today. Press "B" on the elevator. The lab is located at the first door on the left as you exit the elevator.   Due to recent changes in healthcare laws, you may see the results of your imaging and laboratory studies on MyChart before your provider has had a chance to review them.  We understand that in some cases there may be results that are confusing or concerning to you. Not all laboratory results come back in the same time frame and the provider may be waiting for multiple results in order to interpret others.  Please give [place] 48 hours in order for your provider to thoroughly review all the results before contacting the office for clarification of your results.   It was a pleasure to see you today!  Thank you for trusting me with your gastrointestinal care!

## 2022-04-04 LAB — HEPATITIS A ANTIBODY, TOTAL: Hepatitis A AB,Total: NONREACTIVE

## 2022-04-04 LAB — ALPHA-1-ANTITRYPSIN: A-1 Antitrypsin, Ser: 132 mg/dL (ref 83–199)

## 2022-04-04 LAB — ANA: Anti Nuclear Antibody (ANA): NEGATIVE

## 2022-04-04 LAB — IGG: IgG (Immunoglobin G), Serum: 1584 mg/dL (ref 600–1640)

## 2022-04-04 LAB — CERULOPLASMIN: Ceruloplasmin: 33 mg/dL (ref 18–36)

## 2022-04-04 LAB — ANTI-SMOOTH MUSCLE ANTIBODY, IGG: Actin (Smooth Muscle) Antibody (IGG): <20 U (ref <20)

## 2022-04-04 LAB — AFP TUMOR MARKER: AFP-Tumor Marker: 4 ng/mL (ref <6.1)

## 2022-04-04 LAB — MITOCHONDRIAL ANTIBODIES: Mitochondrial M2 Ab, IgG: 23.7 U — ABNORMAL HIGH (ref <=20.0)

## 2022-04-09 ENCOUNTER — Ambulatory Visit (HOSPITAL_COMMUNITY)
Admission: RE | Admit: 2022-04-09 | Discharge: 2022-04-09 | Disposition: A | Discharge status: Home / Self Care | Payer: BC Managed Care – PPO | Source: Ambulatory Visit | Attending: Nurse Practitioner | Admitting: Nurse Practitioner

## 2022-04-09 DIAGNOSIS — K769 Liver disease, unspecified: Secondary | ICD-10-CM | POA: Insufficient documentation

## 2022-04-09 DIAGNOSIS — D649 Anemia, unspecified: Secondary | ICD-10-CM | POA: Diagnosis present

## 2022-04-09 DIAGNOSIS — Z86010 Personal history of colonic polyps: Secondary | ICD-10-CM | POA: Insufficient documentation

## 2022-04-09 DIAGNOSIS — R7989 Other specified abnormal findings of blood chemistry: Secondary | ICD-10-CM | POA: Diagnosis present

## 2022-04-09 DIAGNOSIS — F1090 Alcohol use, unspecified, uncomplicated: Secondary | ICD-10-CM | POA: Diagnosis present

## 2022-04-09 MED ORDER — GADOBUTROL 1 MMOL/ML IV SOLN
6.0000 mL | Freq: Once | INTRAVENOUS | Status: AC | PRN
  Administered 2022-04-09: 6 mL via INTRAVENOUS

## 2022-04-11 ENCOUNTER — Other Ambulatory Visit: Payer: Self-pay

## 2022-04-11 DIAGNOSIS — R7989 Other specified abnormal findings of blood chemistry: Secondary | ICD-10-CM

## 2022-04-11 DIAGNOSIS — D649 Anemia, unspecified: Secondary | ICD-10-CM

## 2022-04-11 MED ORDER — FERROUS SULFATE 325 (65 FE) MG PO TBEC: 325.0000 mg | DELAYED_RELEASE_TABLET | Freq: Every day | ORAL | 0 refills | Status: DC

## 2022-04-20 ENCOUNTER — Telehealth: Payer: Self-pay | Admitting: Nurse Practitioner

## 2022-04-20 ENCOUNTER — Ambulatory Visit (INDEPENDENT_AMBULATORY_CARE_PROVIDER_SITE_OTHER): Payer: BC Managed Care – PPO | Admitting: Family Medicine

## 2022-04-20 ENCOUNTER — Encounter (HOSPITAL_BASED_OUTPATIENT_CLINIC_OR_DEPARTMENT_OTHER): Payer: Self-pay | Admitting: Family Medicine

## 2022-04-20 VITALS — BP 128/85 | HR 92 | Ht 67.0 in | Wt 142.7 lb

## 2022-04-20 DIAGNOSIS — D509 Iron deficiency anemia, unspecified: Secondary | ICD-10-CM | POA: Insufficient documentation

## 2022-04-20 DIAGNOSIS — R7989 Other specified abnormal findings of blood chemistry: Secondary | ICD-10-CM

## 2022-04-20 DIAGNOSIS — I10 Essential (primary) hypertension: Secondary | ICD-10-CM | POA: Diagnosis not present

## 2022-04-20 DIAGNOSIS — D500 Iron deficiency anemia secondary to blood loss (chronic): Secondary | ICD-10-CM

## 2022-04-20 NOTE — Progress Notes (Signed)
    Procedures performed today:    None.  Independent interpretation of notes and tests performed by another provider:   None.  Brief History, Exam, Impression, and Recommendations:    BP 128/85   Pulse 92   Ht '5\' 7"'$  (1.702 m)   Wt 142 lb 11.2 oz (64.7 kg)   SpO2 95%   BMI 22.35 kg/m   Essential hypertension, benign Blood pressure better controlled in office today.  Indicates that he has been checking his blood pressure occasionally at home, will tend to fluctuate, tends to be better in the evening when he is off of work. Not currently having any issues with chest pain or headaches.  Continues with amlodipine 10 mg. Given current control of blood pressure, will continue with current pharmacotherapy, recommend intermittent monitoring of blood pressure at home, DASH diet  Elevated LFTs Reviewed imaging completed since last office visit including right upper quadrant ultrasound and MRI of the liver.  Hepatic steatosis was observed on imaging and MRI also indicated that cystic structure seen in the left lobe of liver which on imaging appears to be of a benign etiology. He has had evaluation with gastroenterology, is also arranged to have colonoscopy later this month. Discussed importance of lifestyle modifications related to observed hepatic steatosis, also provided handout reviewing this information and recommendations.  Iron deficiency anemia Found on recent labs with gastroenterology, was started on daily oral iron supplementation with ferrous sulfate.  He does report that it has been difficult with taking the medication as he has been having notable GI symptoms, has not been eating as well as he has not felt hungry due to GI discomfort. Discussed considerations with patient, recommended that he proceed with every other day dosing and taking medication about every Monday, Wednesday and Friday as this can still allow for appropriate iron administration while potentially reducing impact  of side effects from the iron supplement.  He does have labs scheduled with gastroenterology to be completed as follow-up from iron supplementation  Return in about 3 months (around 07/21/2022) for HTN.   ___________________________________________ Kendalynn Wideman de [place], MD, ABFM, CAQ[facility] [place]

## 2022-04-20 NOTE — Assessment & Plan Note (Signed)
Blood pressure better controlled in office today.  Indicates that he has been checking his blood pressure occasionally at home, will tend to fluctuate, tends to be better in the evening when he is off of work. Not currently having any issues with chest pain or headaches.  Continues with amlodipine 10 mg. Given current control of blood pressure, will continue with current pharmacotherapy, recommend intermittent monitoring of blood pressure at home, DASH diet

## 2022-04-20 NOTE — Telephone Encounter (Signed)
Patient called regarding the excuse note you did for him to be off on 8/29 and 8/30.  He gave it to his employer; however, they would not accept it.  They said it had to come through his insurance company.  He stated the insurance company has faxed you some papers that need to be filled out and returned and they need to state why he will have to be out those two days in more detail.  Any questions, please call patient.  Thank you.

## 2022-04-20 NOTE — Assessment & Plan Note (Signed)
Found on recent labs with gastroenterology, was started on daily oral iron supplementation with ferrous sulfate.  He does report that it has been difficult with taking the medication as he has been having notable GI symptoms, has not been eating as well as he has not felt hungry due to GI discomfort. Discussed considerations with patient, recommended that he proceed with every other day dosing and taking medication about every Monday, Wednesday and Friday as this can still allow for appropriate iron administration while potentially reducing impact of side effects from the iron supplement.  He does have labs scheduled with gastroenterology to be completed as follow-up from iron supplementation

## 2022-04-20 NOTE — Patient Instructions (Signed)

## 2022-04-20 NOTE — Assessment & Plan Note (Signed)
Reviewed imaging completed since last office visit including right upper quadrant ultrasound and MRI of the liver.  Hepatic steatosis was observed on imaging and MRI also indicated that cystic structure seen in the left lobe of liver which on imaging appears to be of a benign etiology. He has had evaluation with gastroenterology, is also arranged to have colonoscopy later this month. Discussed importance of lifestyle modifications related to observed hepatic steatosis, also provided handout reviewing this information and recommendations.

## 2022-04-23 NOTE — Telephone Encounter (Unsigned)
Unable to reach Pt. Pt voice mailbox full: Unable to reach pt.

## 2022-04-25 ENCOUNTER — Encounter: Payer: Self-pay | Admitting: Gastroenterology

## 2022-04-25 NOTE — Telephone Encounter (Unsigned)
Unable to reach Pt. Pt voice mailbox full: Unable to leave voicemail

## 2022-04-26 NOTE — Telephone Encounter (Signed)
Spoke to pt: Pt stated that he is going to come by our office tomorrow and bring paper work from his insurance company for the Dr. To sign: Pt stated that he is having a hard to finding a ride for his procedure and may have to cancel. Pt stated that he will let us know in the morning if has found a ride and if not then he will have to cancel:  Pt verbalized understanding with all questions answered.

## 2022-05-02 ENCOUNTER — Encounter: Payer: BC Managed Care – PPO | Admitting: Gastroenterology

## 2022-05-02 DIAGNOSIS — Z0279 Encounter for issue of other medical certificate: Secondary | ICD-10-CM

## 2022-05-20 ENCOUNTER — Ambulatory Visit
Admission: EM | Admit: 2022-05-20 | Discharge: 2022-05-20 | Disposition: A | Discharge status: Home / Self Care | Payer: BC Managed Care – PPO

## 2022-05-20 ENCOUNTER — Other Ambulatory Visit: Payer: Self-pay

## 2022-05-20 ENCOUNTER — Encounter: Payer: Self-pay | Admitting: Emergency Medicine

## 2022-05-20 DIAGNOSIS — R030 Elevated blood-pressure reading, without diagnosis of hypertension: Secondary | ICD-10-CM | POA: Diagnosis not present

## 2022-05-20 DIAGNOSIS — R42 Dizziness and giddiness: Secondary | ICD-10-CM
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
## 2022-05-20 NOTE — Discharge Instructions (Signed)
Restart your amlodipine and monitor your home blood pressures daily.  Make sure you are drinking plenty of water and staying well-hydrated.  Return to go to the emergency department if your symptoms worsen at any time.  Follow-up with your primary care provider for a recheck as soon as possible.

## 2022-05-20 NOTE — ED Triage Notes (Signed)
Pt sts dizziness x 2 days; pt sts worse with position change; pt sts not taking his bp meds because his iron pills is making him feel sick; pt sts he thinks is because his bp is elevated

## 2022-06-01 ENCOUNTER — Ambulatory Visit: Payer: BC Managed Care – PPO | Admitting: Nurse Practitioner

## 2022-06-07 ENCOUNTER — Encounter: Payer: BC Managed Care – PPO | Admitting: Gastroenterology

## 2022-06-28 ENCOUNTER — Ambulatory Visit: Payer: BC Managed Care – PPO | Admitting: Nurse Practitioner

## 2022-07-09 ENCOUNTER — Other Ambulatory Visit: Payer: Self-pay | Admitting: Gastroenterology

## 2022-07-09 ENCOUNTER — Other Ambulatory Visit (INDEPENDENT_AMBULATORY_CARE_PROVIDER_SITE_OTHER): Payer: BC Managed Care – PPO

## 2022-07-09 ENCOUNTER — Encounter: Payer: Self-pay | Admitting: Gastroenterology

## 2022-07-09 ENCOUNTER — Ambulatory Visit (AMBULATORY_SURGERY_CENTER): Payer: BC Managed Care – PPO | Admitting: Gastroenterology

## 2022-07-09 VITALS — BP 142/84 | HR 59 | Temp 98.6°F | Resp 24 | Ht 67.0 in | Wt 141.0 lb

## 2022-07-09 DIAGNOSIS — K921 Melena: Secondary | ICD-10-CM

## 2022-07-09 DIAGNOSIS — D509 Iron deficiency anemia, unspecified: Secondary | ICD-10-CM

## 2022-07-09 DIAGNOSIS — K208 Other esophagitis without bleeding: Secondary | ICD-10-CM | POA: Diagnosis not present

## 2022-07-09 DIAGNOSIS — K5730 Diverticulosis of large intestine without perforation or abscess without bleeding: Secondary | ICD-10-CM

## 2022-07-09 DIAGNOSIS — Z86010 Personal history of colonic polyps: Secondary | ICD-10-CM

## 2022-07-09 DIAGNOSIS — D122 Benign neoplasm of ascending colon: Secondary | ICD-10-CM

## 2022-07-09 DIAGNOSIS — K641 Second degree hemorrhoids: Secondary | ICD-10-CM

## 2022-07-09 DIAGNOSIS — K31819 Angiodysplasia of stomach and duodenum without bleeding: Secondary | ICD-10-CM | POA: Diagnosis not present

## 2022-07-09 DIAGNOSIS — K2970 Gastritis, unspecified, without bleeding: Secondary | ICD-10-CM | POA: Diagnosis not present

## 2022-07-09 DIAGNOSIS — B3781 Candidal esophagitis: Secondary | ICD-10-CM

## 2022-07-09 LAB — CBC WITH DIFFERENTIAL/PLATELET
Basophils Absolute: 0.1 10*3/uL (ref 0.0–0.1)
Basophils Relative: 3.2 % — ABNORMAL HIGH (ref 0.0–3.0)
Eosinophils Absolute: 0.1 10*3/uL (ref 0.0–0.7)
Eosinophils Relative: 3.5 % (ref 0.0–5.0)
HCT: 34.4 % — ABNORMAL LOW (ref 39.0–52.0)
Hemoglobin: 11 g/dL — ABNORMAL LOW (ref 13.0–17.0)
Lymphocytes Relative: 30.5 % (ref 12.0–46.0)
Lymphs Abs: 1.3 10*3/uL (ref 0.7–4.0)
MCHC: 31.9 g/dL (ref 30.0–36.0)
MCV: 82.7 fl (ref 78.0–100.0)
Monocytes Absolute: 0.6 10*3/uL (ref 0.1–1.0)
Monocytes Relative: 13.5 % — ABNORMAL HIGH (ref 3.0–12.0)
Neutro Abs: 2 10*3/uL (ref 1.4–7.7)
Neutrophils Relative %: 49.3 % (ref 43.0–77.0)
Platelets: 379 10*3/uL (ref 150.0–400.0)
RBC: 4.16 Mil/uL — ABNORMAL LOW (ref 4.22–5.81)
RDW: 19 % — ABNORMAL HIGH (ref 11.5–15.5)
WBC: 4.1 10*3/uL (ref 4.0–10.5)

## 2022-07-09 MED ORDER — SODIUM CHLORIDE 0.9 % IV SOLN: 500.0000 mL | INTRAVENOUS | Status: DC

## 2022-07-09 MED ORDER — PANTOPRAZOLE SODIUM 40 MG PO TBEC: 40.0000 mg | DELAYED_RELEASE_TABLET | Freq: Every day | ORAL | 3 refills | Status: DC

## 2022-07-09 NOTE — Progress Notes (Signed)
Sedate, gd SR, tolerated procedure well, VSS, report to RN 

## 2022-07-09 NOTE — Op Note (Signed)
[facility] Patient Name: Mark Mendez Procedure Date: 07/09/2022 10:28 AM MRN: [id number] Endoscopist: Ladene Artist , MD, [id number] Age: 56 Referring MD:  Date of Birth: [birth year] Gender: Male Account #: [id number] Procedure:                Upper GI endoscopy Indications:              Iron deficiency anemia Medicines:                Monitored Anesthesia Care Procedure:                Pre-Anesthesia Assessment:                           - Prior to the procedure, a History and Physical                            was performed, and patient medications and                            allergies were reviewed. The patient's tolerance of                            previous anesthesia was also reviewed. The risks                            and benefits of the procedure and the sedation                            options and risks were discussed with the patient.                            All questions were answered, and informed consent                            was obtained. Prior Anticoagulants: The patient has                            taken no anticoagulant or antiplatelet agents. ASA                            Grade Assessment: II - A patient with mild systemic                            disease. After reviewing the risks and benefits,                            the patient was deemed in satisfactory condition to                            undergo the procedure.                           After obtaining informed consent, the endoscope was  passed under direct vision. Throughout the                            procedure, the patient's blood pressure, pulse, and                            oxygen saturations were monitored continuously. The                            Endoscope was introduced through the mouth, and                            advanced to the second part of duodenum. The upper                            GI endoscopy was accomplished  without difficulty.                            The patient tolerated the procedure well. Scope In: Scope Out: Findings:                 Patchy, white plaques were found in the mid                            esophagus and in the distal esophagus. Biopsies                            were taken with a cold forceps for histology.                           The exam of the esophagus was otherwise normal.                           Diffuse moderate inflammation characterized by                            erythema, friability and granularity was found in                            the gastric body and fundus. Biopsies were taken                            with a cold forceps for histology.                           The exam of the stomach was otherwise normal.                           Patchy mildly erythematous mucosa without active                            bleeding and with no stigmata of bleeding was found  in the duodenal bulb.                           One 2 mm angiodysplastic lesion without bleeding                            was found in the second portion of the duodenum.                           The exam of the duodenum was otherwise normal. Complications:            No immediate complications. Estimated Blood Loss:     Estimated blood loss was minimal. Impression:               - Esophageal plaques were found, suspicious for                            candidiasis. Biopsied.                           - Gastritis. Biopsied.                           - Erythematous duodenopathy.                           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 Recommendation:           - Patient has a contact number available for                            emergencies. The signs and symptoms of potential                            delayed complications were discussed with the                            patient. Return to normal  activities tomorrow.                            Written discharge instructions were provided to the                            patient.                           - Resume previous diet.                           - Continue present medications.                           - Pantoprazole 40 mg po qd, 1 year of refills.                           -  CBC, Fe, TIBC, ferritin today.                           - Await pathology results. Ladene Artist, MD 07/09/2022 11:30:54 AM This report has been signed electronically.

## 2022-07-09 NOTE — Progress Notes (Signed)
Called to room to assist during endoscopic procedure.  Patient ID and intended procedure confirmed with present staff. Received instructions for my participation in the procedure from the performing physician.  
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
## 2022-07-09 NOTE — Patient Instructions (Addendum)
Information on gastritis, polyps and diverticulosis given to you today.  Await pathology results.  Resume previous diet and medications.  Start pantoprazole 40 mg by mouth one a day.  CBC, FE, TIBC, ferritin today.    YOU HAD AN ENDOSCOPIC PROCEDURE TODAY AT [facility]:   Refer to the procedure report that was given to you for any specific questions about what was found during the examination.  If the procedure report does not answer your questions, please call your gastroenterologist to clarify.  If you requested that your care partner not be given the details of your procedure findings, then the procedure report has been included in a sealed envelope for you to review at your convenience later.  YOU SHOULD EXPECT: Some feelings of bloating in the abdomen. Passage of more gas than usual.  Walking can help get rid of the air that was put into your GI tract during the procedure and reduce the bloating. If you had a lower endoscopy (such as a colonoscopy or flexible sigmoidoscopy) you may notice spotting of blood in your stool or on the toilet paper. If you underwent a bowel prep for your procedure, you may not have a normal bowel movement for a few days.  Please Note:  You might notice some irritation and congestion in your nose or some drainage.  This is from the oxygen used during your procedure.  There is no need for concern and it should clear up in a day or so.  SYMPTOMS TO REPORT IMMEDIATELY:  Following lower endoscopy (colonoscopy or flexible sigmoidoscopy):  Excessive amounts of blood in the stool  Significant tenderness or worsening of abdominal pains  Swelling of the abdomen that is new, acute  Fever of 100F or higher  Following upper endoscopy (EGD)  Vomiting of blood or coffee ground material  New chest pain or pain under the shoulder blades  Painful or persistently difficult swallowing  New shortness of breath  Fever of 100F or higher  Black,  tarry-looking stools  For urgent or emergent issues, a gastroenterologist can be reached at any hour by calling [phone]. Do not use MyChart messaging for urgent concerns.    DIET:  We do recommend a small meal at first, but then you may proceed to your regular diet.  Drink plenty of fluids but you should avoid alcoholic beverages for 24 hours.  ACTIVITY:  You should plan to take it easy for the rest of today and you should NOT DRIVE or use heavy machinery until tomorrow (because of the sedation medicines used during the test).    FOLLOW UP: Our staff will call the number listed on your records the next business day following your procedure.  We will call around 7:15- 8:00 am to check on you and address any questions or concerns that you may have regarding the information given to you following your procedure. If we do not reach you, we will leave a message.     If any biopsies were taken you will be contacted by phone or by letter within the next 1-3 weeks.  Please call us at [phone] if you have not heard about the biopsies in 3 weeks.    SIGNATURES/CONFIDENTIALITY: You and/or your care partner have signed paperwork which will be entered into your electronic medical record.  These signatures attest to the fact that that the information above on your After Visit Summary has been reviewed and is understood.  Full responsibility of the confidentiality of this  discharge information lies with you and/or your care-partner. 

## 2022-07-09 NOTE — Op Note (Signed)
[place] Patient Name: Mark Mendez Procedure Date: 07/09/2022 10:28 AM MRN: [id number] Endoscopist: Ladene Artist , MD, [id number] Age: 56 Referring MD:  Date of Birth: August 15, 1966 Gender: Male Account #: [id number] Procedure:                Colonoscopy Indications:              Hematochezia, Iron deficiency anemia, Personal                            history of adenomatous colon polyps Medicines:                Monitored Anesthesia Care Procedure:                Pre-Anesthesia Assessment:                           - Prior to the procedure, a History and Physical                            was performed, and patient medications and                            allergies were reviewed. The patient's tolerance of                            previous anesthesia was also reviewed. The risks                            and benefits of the procedure and the sedation                            options and risks were discussed with the patient.                            All questions were answered, and informed consent                            was obtained. Prior Anticoagulants: The patient has                            taken no anticoagulant or antiplatelet agents. ASA                            Grade Assessment: II - A patient with mild systemic                            disease. After reviewing the risks and benefits,                            the patient was deemed in satisfactory condition to                            undergo the procedure.  After obtaining informed consent, the colonoscope                            was passed under direct vision. Throughout the                            procedure, the patient's blood pressure, pulse, and                            oxygen saturations were monitored continuously. The                            CF [id number] #[id number] was introduced through the anus                            and advanced to the the  cecum, identified by                            appendiceal orifice and ileocecal valve. The                            ileocecal valve, appendiceal orifice, and rectum                            were photographed. The quality of the bowel                            preparation was good. The colonoscopy was performed                            without difficulty. The patient tolerated the                            procedure well. Scope In: 10:50:56 AM Scope Out: 11:06:33 AM Scope Withdrawal Time: 0 hours 13 minutes 6 seconds  Total Procedure Duration: 0 hours 15 minutes 37 seconds  Findings:                 External tags were found on perianal exam.                           A 4 mm polyp was found in the ascending colon. The                            polyp was sessile. The polyp was removed with a                            cold biopsy forceps. Resection and retrieval were                            complete.                           Multiple medium-mouthed diverticula were found in  the left colon. There was evidence of diverticular                            spasm. Peri-diverticular erythema was seen. There                            was no evidence of diverticular bleeding.                           Internal hemorrhoids were found during                            retroflexion. The hemorrhoids were moderate and                            Grade II (internal hemorrhoids that prolapse but                            reduce spontaneously).                           The exam was otherwise without abnormality on                            direct and retroflexion views. Complications:            No immediate complications. Estimated blood loss:                            None. Estimated Blood Loss:     Estimated blood loss: none. Impression:               - External tags found on perianal exam.                           - One 4 mm polyp in the ascending colon,  removed                            with a cold biopsy forceps. Resected and retrieved.                           - Moderate diverticulosis in the left colon.                           - Internal hemorrhoids.                           - The examination was otherwise normal on direct                            and retroflexion views. Recommendation:           - Repeat colonoscopy, likely 5 years, date to be                            determined after pending pathology results are  reviewed for surveillance based on pathology                            results.                           - Patient has a contact number available for                            emergencies. The signs and symptoms of potential                            delayed complications were discussed with the                            patient. Return to normal activities tomorrow.                            Written discharge instructions were provided to the                            patient.                           - High fiber diet.                           - Continue present medications.                           - Await pathology results.                           - Recommend hemorrhoid banding. Ladene Artist, MD 07/09/2022 11:21:42 AM This report has been signed electronically.

## 2022-07-10 ENCOUNTER — Telehealth: Payer: Self-pay

## 2022-07-10 LAB — IRON,TIBC AND FERRITIN PANEL
%SAT: 6 % (calc) — ABNORMAL LOW (ref 20–48)
Ferritin: 12 ng/mL — ABNORMAL LOW (ref 38–380)
Iron: 27 ug/dL — ABNORMAL LOW (ref 50–180)
TIBC: 422 mcg/dL (calc) (ref 250–425)

## 2022-07-10 NOTE — Telephone Encounter (Signed)
  Follow up Call-     07/09/2022    9:59 AM 02/26/2020    1:41 PM  Call back number  Post procedure Call Back phone  # [phone]  Permission to leave phone message Yes Yes     Patient questions:  Do you have a fever, pain , or abdominal swelling? No. Pain Score  0 *  Have you tolerated food without any problems? Yes.    Have you been able to return to your normal activities? Yes.    Do you have any questions about your discharge instructions: Diet   No. Medications  No. Follow up visit  No.  Do you have questions or concerns about your Care? No.  Actions: * If pain score is 4 or above: No action needed, pain <4.

## 2022-07-11 ENCOUNTER — Other Ambulatory Visit: Payer: Self-pay

## 2022-07-11 DIAGNOSIS — D509 Iron deficiency anemia, unspecified: Secondary | ICD-10-CM

## 2022-07-11 MED ORDER — POLYSACCHARIDE IRON COMPLEX 150 MG PO CAPS: 150.0000 mg | ORAL_CAPSULE | Freq: Every day | ORAL | 2 refills | Status: DC

## 2022-07-20 ENCOUNTER — Ambulatory Visit (HOSPITAL_BASED_OUTPATIENT_CLINIC_OR_DEPARTMENT_OTHER): Payer: BC Managed Care – PPO | Admitting: Family Medicine
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
## 2022-07-23 ENCOUNTER — Ambulatory Visit: Payer: BC Managed Care – PPO | Admitting: Nurse Practitioner

## 2022-08-30 ENCOUNTER — Ambulatory Visit (HOSPITAL_BASED_OUTPATIENT_CLINIC_OR_DEPARTMENT_OTHER): Payer: BC Managed Care – PPO | Admitting: Family Medicine

## 2022-08-30 ENCOUNTER — Encounter (HOSPITAL_BASED_OUTPATIENT_CLINIC_OR_DEPARTMENT_OTHER): Payer: Self-pay

## 2022-09-07 ENCOUNTER — Telehealth: Payer: Self-pay | Admitting: Gastroenterology

## 2022-09-07 ENCOUNTER — Ambulatory Visit: Payer: BC Managed Care – PPO | Admitting: Gastroenterology

## 2022-09-07 NOTE — Telephone Encounter (Signed)
Good morning Dr. Lyndel Safe,   Patient called stating that he needed to reschedule his appointment with you today at 8:30 with no reason given.   Patient was rescheduled for 2/9 at 10:40.

## 2022-09-07 NOTE — Telephone Encounter (Signed)
Dr. Lynne Leader patient I did his colonoscopy as Dr. Fuller Plan was not available that day.   Please reschedule FU appt with Dr. Fuller Plan or APP clinic. RG

## 2022-09-24 ENCOUNTER — Encounter (HOSPITAL_BASED_OUTPATIENT_CLINIC_OR_DEPARTMENT_OTHER): Payer: Self-pay

## 2022-10-03 ENCOUNTER — Ambulatory Visit (HOSPITAL_BASED_OUTPATIENT_CLINIC_OR_DEPARTMENT_OTHER): Payer: BC Managed Care – PPO | Admitting: Family Medicine

## 2022-10-12 ENCOUNTER — Ambulatory Visit: Payer: BC Managed Care – PPO | Admitting: Gastroenterology

## 2022-10-12 ENCOUNTER — Encounter: Payer: Self-pay | Admitting: Nurse Practitioner

## 2022-10-12 ENCOUNTER — Other Ambulatory Visit (INDEPENDENT_AMBULATORY_CARE_PROVIDER_SITE_OTHER): Payer: BC Managed Care – PPO

## 2022-10-12 ENCOUNTER — Ambulatory Visit (INDEPENDENT_AMBULATORY_CARE_PROVIDER_SITE_OTHER): Payer: BC Managed Care – PPO | Admitting: Nurse Practitioner

## 2022-10-12 VITALS — BP 134/86 | HR 112 | Ht 67.0 in | Wt 145.4 lb

## 2022-10-12 DIAGNOSIS — F1090 Alcohol use, unspecified, uncomplicated: Secondary | ICD-10-CM

## 2022-10-12 DIAGNOSIS — K760 Fatty (change of) liver, not elsewhere classified: Secondary | ICD-10-CM

## 2022-10-12 DIAGNOSIS — R7989 Other specified abnormal findings of blood chemistry: Secondary | ICD-10-CM

## 2022-10-12 LAB — COMPREHENSIVE METABOLIC PANEL
ALT: 43 U/L (ref 0–53)
AST: 59 U/L — ABNORMAL HIGH (ref 0–37)
Albumin: 4.2 g/dL (ref 3.5–5.2)
Alkaline Phosphatase: 70 U/L (ref 39–117)
BUN: 10 mg/dL (ref 6–23)
CO2: 21 mEq/L (ref 19–32)
Calcium: 9.1 mg/dL (ref 8.4–10.5)
Chloride: 106 mEq/L (ref 96–112)
Creatinine, Ser: 0.79 mg/dL (ref 0.40–1.50)
GFR: 99.59 mL/min (ref >60.00)
Glucose, Bld: 75 mg/dL (ref 70–99)
Potassium: 4.1 mEq/L (ref 3.5–5.1)
Sodium: 138 mEq/L (ref 135–145)
Total Bilirubin: 0.3 mg/dL (ref 0.2–1.2)
Total Protein: 7.8 g/dL (ref 6.0–8.3)

## 2022-10-12 LAB — IBC + FERRITIN
Ferritin: 10.6 ng/mL — ABNORMAL LOW (ref 22.0–322.0)
Iron: 23 ug/dL — ABNORMAL LOW (ref 42–165)
Saturation Ratios: 4.6 % — ABNORMAL LOW (ref 20.0–50.0)
TIBC: 505.4 ug/dL — ABNORMAL HIGH (ref 250.0–450.0)
Transferrin: 361 mg/dL — ABNORMAL HIGH (ref 212.0–360.0)

## 2022-10-12 LAB — CBC WITH DIFFERENTIAL/PLATELET
Basophils Absolute: 0.1 10*3/uL (ref 0.0–0.1)
Basophils Relative: 0.7 % (ref 0.0–3.0)
Eosinophils Absolute: 0.2 10*3/uL (ref 0.0–0.7)
Eosinophils Relative: 3 % (ref 0.0–5.0)
HCT: 34.1 % — ABNORMAL LOW (ref 39.0–52.0)
Hemoglobin: 10.9 g/dL — ABNORMAL LOW (ref 13.0–17.0)
Lymphocytes Relative: 29.9 % (ref 12.0–46.0)
Lymphs Abs: 2.4 10*3/uL (ref 0.7–4.0)
MCHC: 32 g/dL (ref 30.0–36.0)
MCV: 74.5 fl — ABNORMAL LOW (ref 78.0–100.0)
Monocytes Absolute: 0.6 10*3/uL (ref 0.1–1.0)
Monocytes Relative: 6.9 % (ref 3.0–12.0)
Neutro Abs: 4.8 10*3/uL (ref 1.4–7.7)
Neutrophils Relative %: 59.5 % (ref 43.0–77.0)
Platelets: 467 10*3/uL — ABNORMAL HIGH (ref 150.0–400.0)
RBC: 4.58 Mil/uL (ref 4.22–5.81)
RDW: 24.1 % — ABNORMAL HIGH (ref 11.5–15.5)
WBC: 8 10*3/uL (ref 4.0–10.5)

## 2022-10-12 MED ORDER — PANTOPRAZOLE SODIUM 40 MG PO TBEC: 40.0000 mg | DELAYED_RELEASE_TABLET | Freq: Every day | ORAL | 3 refills | Status: DC

## 2022-10-12 NOTE — Patient Instructions (Addendum)
Your provider has requested that you go to the basement level for lab work before leaving today. Press "B" on the elevator. The lab is located at the first door on the left as you exit the elevator.   We have sent the following medications to your pharmacy for you to pick up at your convenience: Pantoprazole 40 mg  Due to recent changes in healthcare laws, you may see the results of your imaging and laboratory studies on MyChart before your provider has had a chance to review them.  We understand that in some cases there may be results that are confusing or concerning to you. Not all laboratory results come back in the same time frame and the provider may be waiting for multiple results in order to interpret others.  Please give [place] 48 hours in order for your provider to thoroughly review all the results before contacting the office for clarification of your results.    Thank you for trusting me with your gastrointestinal care!   Carl Best, CRNP
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
## 2022-10-15 NOTE — Progress Notes (Signed)
Reviewed and agree with management plan. Consider Nu-Iron 150 or other oral iron preparation if the continue to experience side effects from his current oral iron.   Pricilla Riffle. Fuller Plan, MD [facility]

## 2022-10-19 LAB — MITOCHONDRIAL ANTIBODIES: Mitochondrial M2 Ab, IgG: 27.1 U — ABNORMAL HIGH (ref <=20.0)

## 2022-10-25 ENCOUNTER — Other Ambulatory Visit: Payer: Self-pay

## 2022-10-25 DIAGNOSIS — D509 Iron deficiency anemia, unspecified: Secondary | ICD-10-CM

## 2022-10-26 ENCOUNTER — Telehealth: Payer: Self-pay | Admitting: Nurse Practitioner

## 2022-10-26 NOTE — Telephone Encounter (Signed)
Spoke with pt. Documented under results notes. Pt verbalized understanding with all questions answered.

## 2022-10-26 NOTE — Telephone Encounter (Signed)
Patient returned call, please advise.  

## 2022-10-29 ENCOUNTER — Telehealth: Payer: Self-pay | Admitting: Oncology

## 2022-10-29 NOTE — Telephone Encounter (Signed)
Scheduled appt per 2/22 referral. Pt is aware of appt date and time. Pt is aware to arrive 15 mins prior to appt time and to bring and updated insurance card. Pt is aware of appt location.

## 2022-11-16 ENCOUNTER — Ambulatory Visit (INDEPENDENT_AMBULATORY_CARE_PROVIDER_SITE_OTHER): Payer: BC Managed Care – PPO | Admitting: Family Medicine

## 2022-11-16 ENCOUNTER — Encounter (HOSPITAL_BASED_OUTPATIENT_CLINIC_OR_DEPARTMENT_OTHER): Payer: Self-pay | Admitting: Family Medicine

## 2022-11-16 VITALS — Ht 67.0 in | Wt 148.0 lb

## 2022-11-16 DIAGNOSIS — R051 Acute cough: Secondary | ICD-10-CM | POA: Diagnosis not present

## 2022-11-16 MED ORDER — LORATADINE 10 MG PO TABS: 10.0000 mg | ORAL_TABLET | Freq: Every day | ORAL | 11 refills | Status: AC

## 2022-11-16 MED ORDER — FLUTICASONE PROPIONATE 50 MCG/ACT NA SUSP: 2.0000 | Freq: Every day | NASAL | 6 refills | Status: AC

## 2022-11-16 NOTE — Progress Notes (Signed)
Acute Office Visit via Telephone   I connected with  Mark Mendez on 11/16/22 by a phone call and verified that I am speaking with the correct person using two identifiers.   I discussed the limitations of evaluation and management by telemedicine. The patient expressed understanding and agreed to proceed.   Subjective:     Patient ID: Mark Mendez, male    DOB: 1965-10-24, 57 y.o.   MRN: [id number]  Chief Complaint  Patient presents with   Cough    Ongoing for about 2 days, feels congestion in chest   Denies body aches, chills, fever, chest pain, shortness of breath, N/V, abdominal pain, sore throat, lightheaded, dizziness, headache.  Feels like he has mucous in his chest. Persistent cough throughout the day. Has not been around anyone who is sick  Reports seasonal allergies, which have been more bothersome this past week. Not trying any OTC medications. Denies persistent cough throughout the night.    Review of Systems  Constitutional:  Negative for chills and fever.  HENT:  Negative for congestion and sore throat.   Respiratory:  Positive for cough and sputum production (feels like he phelgm is in his throat but not productive). Negative for shortness of breath and wheezing.   Cardiovascular:  Negative for chest pain.  Gastrointestinal:  Negative for abdominal pain, nausea and vomiting.  Musculoskeletal:  Negative for myalgias.  Neurological:  Negative for dizziness and headaches.  Endo/Heme/Allergies:  Positive for environmental allergies.       Objective:    Ht 5\' 7"  (1.702 m)   Wt 148 lb (67.1 kg)   BMI 23.18 kg/m  Patient unable to obtain vital signs   Physical Exam  Unable to perform physical exam due to telephone visit      Assessment & Plan:   Problem List Items Addressed This Visit       Other   Acute cough - Primary    Patient is a 57 y.o. male who complains of cough for 2 days. He denies a history of chills, dizziness, fatigue, fevers,  myalgias, nausea, shortness of breath, sweats, vomiting, and wheezing and denies a history of asthma. Based on limitation of not being an in-person visit, unable to examine ears, throat/pharynx, nasal passages, sinuses and lymph node involvement. Most importantly, unable to listen to lung sounds. Based on HPI, a bacterial or viral infection is low on the differential at this time. Symptoms may be due to environmental allergies. Will trial oral antihistamine and nasal corticosteroid, along with symptomatic treatment.  Will send medications to pharmacy (which was verified with patient). Advised patient to push fluids, rest, OTC cough suppressant of choice as needed. Lack of antibiotic effectiveness discussed with him. Will provide information regarding allergic rhinitis via MyChart. Educated patient if he experiences a worsening of symptoms, chest pain, shortness of breath, to seek emergency care at urgent care or ED. Patient reports understanding. Call or return to clinic prn if these symptoms worsen or fail to improve as anticipated.       Relevant Medications   fluticasone (FLONASE) 50 MCG/ACT nasal spray   loratadine (CLARITIN) 10 MG tablet    Meds ordered this encounter  Medications   fluticasone (FLONASE) 50 MCG/ACT nasal spray    Sig: Place 2 sprays into both nostrils daily.    Dispense:  16 g    Refill:  6    Order Specific Question:   Supervising Provider    Answer:   DE [place], RAYMOND  [id number]   loratadine (CLARITIN) 10 MG tablet    Sig: Take 1 tablet (10 mg total) by mouth daily.    Dispense:  30 tablet    Refill:  11    Order Specific Question:   Supervising Provider    Answer:   DE [place], RAYMOND [id number]    Return in about 2 weeks (around 11/30/2022) for BP check & assessment of acute cough/allergies.  Les Pou, FNP

## 2022-11-16 NOTE — Assessment & Plan Note (Addendum)
Patient is a 57 y.o. male who complains of cough for 2 days. He denies a history of chills, dizziness, fatigue, fevers, myalgias, nausea, shortness of breath, sweats, vomiting, and wheezing and denies a history of asthma. Based on limitation of not being an in-person visit, unable to examine ears, throat/pharynx, nasal passages, sinuses and lymph node involvement. Most importantly, unable to listen to lung sounds. Based on HPI, a bacterial or viral infection is low on the differential at this time. Symptoms may be due to environmental allergies. Will trial oral antihistamine and nasal corticosteroid, along with symptomatic treatment.  Will send medications to pharmacy (which was verified with patient). Advised patient to push fluids, rest, OTC cough suppressant of choice as needed. Lack of antibiotic effectiveness discussed with him. Will provide information regarding allergic rhinitis via MyChart. Educated patient if he experiences a worsening of symptoms, chest pain, shortness of breath, to seek emergency care at urgent care or ED. Patient reports understanding. Call or return to clinic prn if these symptoms worsen or fail to improve as anticipated.

## 2022-11-23 ENCOUNTER — Inpatient Hospital Stay: Payer: BC Managed Care – PPO

## 2022-11-23 ENCOUNTER — Inpatient Hospital Stay: Payer: BC Managed Care – PPO | Attending: Oncology | Admitting: Oncology

## 2022-11-28 ENCOUNTER — Encounter (HOSPITAL_BASED_OUTPATIENT_CLINIC_OR_DEPARTMENT_OTHER): Payer: Self-pay | Admitting: Family Medicine

## 2022-11-28 ENCOUNTER — Ambulatory Visit (INDEPENDENT_AMBULATORY_CARE_PROVIDER_SITE_OTHER): Payer: BC Managed Care – PPO | Admitting: Family Medicine

## 2022-11-28 VITALS — BP 150/107 | HR 106 | Ht 67.0 in | Wt 147.0 lb

## 2022-11-28 DIAGNOSIS — I10 Essential (primary) hypertension: Secondary | ICD-10-CM | POA: Diagnosis not present

## 2022-11-28 NOTE — Progress Notes (Signed)
    Procedures performed today:    None.  Independent interpretation of notes and tests performed by another provider:   None.  Brief History, Exam, Impression, and Recommendations:    BP (!) 150/107 (BP Location: Right Arm, Patient Position: Sitting, Cuff Size: Normal)   Pulse (!) 106   Ht 5\' 7"  (1.702 m)   Wt 147 lb (66.7 kg)   SpO2 100%   BMI 23.02 kg/m   Essential hypertension, benign Patient is overdue for follow-up of chronic medical issues.  Blood pressure today is elevated on initial reading, recheck remains elevated as well.  He does report that he has been inconsistent with taking amlodipine and occasionally has missed some doses.  He does not check blood pressure regularly at home, is able to check it at work, however has not been doing so.  No current issues with chest pain or headaches. We discussed options today pertaining to management of blood pressure.  After discussion, patient elected to proceed with current medication regimen and is working to ensure better compliance with regular administration of medication daily.  We also discussed lifestyle modifications including DASH diet, regular aerobic exercise, handout provided reviewing DASH diet Plan for close follow-up to assess progress with blood pressure.  If blood pressure continues to be elevated despite regular use of amlodipine, would recommend proceeding with addition of second antihypertensive, likely ARB medication  Return in about 8 weeks (around 01/23/2023) for HTN.   ___________________________________________ Gerhart Ruggieri de [place], MD, ABFM, CAQ[facility] [place]

## 2022-11-28 NOTE — Assessment & Plan Note (Addendum)
Patient is overdue for follow-up of chronic medical issues.  Blood pressure today is elevated on initial reading, recheck remains elevated as well.  He does report that he has been inconsistent with taking amlodipine and occasionally has missed some doses.  He does not check blood pressure regularly at home, is able to check it at work, however has not been doing so.  No current issues with chest pain or headaches. We discussed options today pertaining to management of blood pressure.  After discussion, patient elected to proceed with current medication regimen and is working to ensure better compliance with regular administration of medication daily.  We also discussed lifestyle modifications including DASH diet, regular aerobic exercise, handout provided reviewing DASH diet Plan for close follow-up to assess progress with blood pressure.  If blood pressure continues to be elevated despite regular use of amlodipine, would recommend proceeding with addition of second antihypertensive, likely ARB medication

## 2022-12-07 ENCOUNTER — Ambulatory Visit (HOSPITAL_BASED_OUTPATIENT_CLINIC_OR_DEPARTMENT_OTHER): Payer: BC Managed Care – PPO | Admitting: Family Medicine
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
## 2022-12-11 ENCOUNTER — Encounter: Payer: Self-pay | Admitting: Physician Assistant

## 2022-12-11 ENCOUNTER — Other Ambulatory Visit: Payer: Self-pay

## 2022-12-11 ENCOUNTER — Inpatient Hospital Stay: Payer: BC Managed Care – PPO

## 2022-12-11 ENCOUNTER — Inpatient Hospital Stay: Payer: BC Managed Care – PPO | Attending: Oncology | Admitting: Physician Assistant

## 2022-12-11 VITALS — BP 138/92 | HR 112 | Temp 98.1°F | Resp 17 | Wt 146.6 lb

## 2022-12-11 DIAGNOSIS — F17290 Nicotine dependence, other tobacco product, uncomplicated: Secondary | ICD-10-CM | POA: Insufficient documentation

## 2022-12-11 DIAGNOSIS — D500 Iron deficiency anemia secondary to blood loss (chronic): Secondary | ICD-10-CM

## 2022-12-11 DIAGNOSIS — I10 Essential (primary) hypertension: Secondary | ICD-10-CM | POA: Diagnosis not present

## 2022-12-11 DIAGNOSIS — R5383 Other fatigue: Secondary | ICD-10-CM | POA: Diagnosis not present

## 2022-12-11 DIAGNOSIS — D509 Iron deficiency anemia, unspecified: Secondary | ICD-10-CM | POA: Insufficient documentation

## 2022-12-11 LAB — CBC WITH DIFFERENTIAL (CANCER CENTER ONLY)
Abs Immature Granulocytes: 0.03 10*3/uL (ref 0.00–0.07)
Basophils Absolute: 0 10*3/uL (ref 0.0–0.1)
Basophils Relative: 1 %
Eosinophils Absolute: 0.1 10*3/uL (ref 0.0–0.5)
Eosinophils Relative: 2 %
HCT: 34.7 % — ABNORMAL LOW (ref 39.0–52.0)
Hemoglobin: 11.1 g/dL — ABNORMAL LOW (ref 13.0–17.0)
Immature Granulocytes: 1 %
Lymphocytes Relative: 33 %
Lymphs Abs: 2 10*3/uL (ref 0.7–4.0)
MCH: 24.7 pg — ABNORMAL LOW (ref 26.0–34.0)
MCHC: 32 g/dL (ref 30.0–36.0)
MCV: 77.3 fL — ABNORMAL LOW (ref 80.0–100.0)
Monocytes Absolute: 0.7 10*3/uL (ref 0.1–1.0)
Monocytes Relative: 11 %
Neutro Abs: 3.2 10*3/uL (ref 1.7–7.7)
Neutrophils Relative %: 52 %
Platelet Count: 365 10*3/uL (ref 150–400)
RBC: 4.49 MIL/uL (ref 4.22–5.81)
RDW: 22.4 % — ABNORMAL HIGH (ref 11.5–15.5)
WBC Count: 6 10*3/uL (ref 4.0–10.5)
nRBC: 0 % (ref 0.0–0.2)

## 2022-12-11 LAB — CMP (CANCER CENTER ONLY)
ALT: 60 U/L — ABNORMAL HIGH (ref 0–44)
AST: 110 U/L — ABNORMAL HIGH (ref 15–41)
Albumin: 4.2 g/dL (ref 3.5–5.0)
Alkaline Phosphatase: 83 U/L (ref 38–126)
Anion gap: 6 (ref 5–15)
BUN: 12 mg/dL (ref 6–20)
CO2: 25 mmol/L (ref 22–32)
Calcium: 9.4 mg/dL (ref 8.9–10.3)
Chloride: 105 mmol/L (ref 98–111)
Creatinine: 0.89 mg/dL (ref 0.61–1.24)
GFR, Estimated: >60 mL/min (ref >60)
Glucose, Bld: 92 mg/dL (ref 70–99)
Potassium: 4 mmol/L (ref 3.5–5.1)
Sodium: 136 mmol/L (ref 135–145)
Total Bilirubin: 0.3 mg/dL (ref 0.3–1.2)
Total Protein: 8 g/dL (ref 6.5–8.1)

## 2022-12-11 LAB — IRON AND IRON BINDING CAPACITY (CC-WL,HP ONLY)
Iron: 43 ug/dL — ABNORMAL LOW (ref 45–182)
Saturation Ratios: 9 % — ABNORMAL LOW (ref 17.9–39.5)
TIBC: 480 ug/dL — ABNORMAL HIGH (ref 250–450)
UIBC: 437 ug/dL — ABNORMAL HIGH (ref 117–376)

## 2022-12-11 LAB — RETIC PANEL
Immature Retic Fract: 38 % — ABNORMAL HIGH (ref 2.3–15.9)
RBC.: 4.46 MIL/uL (ref 4.22–5.81)
Retic Count, Absolute: 78.1 10*3/uL (ref 19.0–186.0)
Retic Ct Pct: 1.8 % (ref 0.4–3.1)
Reticulocyte Hemoglobin: 27.3 pg — ABNORMAL LOW (ref >27.9)

## 2022-12-12 ENCOUNTER — Telehealth: Payer: Self-pay | Admitting: Pharmacy Technician

## 2022-12-12 ENCOUNTER — Telehealth: Payer: Self-pay

## 2022-12-12 ENCOUNTER — Other Ambulatory Visit: Payer: Self-pay | Admitting: Pharmacy Technician

## 2022-12-12 ENCOUNTER — Telehealth: Payer: Self-pay | Admitting: Physician Assistant

## 2022-12-12 LAB — FERRITIN: Ferritin: 16 ng/mL — ABNORMAL LOW (ref 24–336)

## 2022-12-12 NOTE — Telephone Encounter (Signed)
Pt advised and voiced understanding.  He is aware after insurance approval he will be notified by [facility] scheduling.

## 2022-12-12 NOTE — Telephone Encounter (Signed)
Wallie Char is non preferred medication with his BC[employer] plan and will be denied if patient has not failed venofer. Would you like to try venofer?

## 2022-12-12 NOTE — Telephone Encounter (Signed)
Reached out to patient to schedule per 4/9 LOS, patient aware of date and time of appointments.

## 2022-12-12 NOTE — Telephone Encounter (Signed)
-----   Message from Briant Cedar, PA-C sent at 12/12/2022  8:48 AM EDT ----- Please notify patient that labs from yesterday confirm iron def anemia. We will arrange for IV iron.    ----- Message ----- From: Leory Plowman, [facility] [place] Sent: 12/11/2022   3:38 PM EDT To: Briant Cedar, PA-C

## 2022-12-14 ENCOUNTER — Ambulatory Visit (HOSPITAL_BASED_OUTPATIENT_CLINIC_OR_DEPARTMENT_OTHER): Payer: BC Managed Care – PPO | Admitting: Family Medicine

## 2022-12-16 ENCOUNTER — Encounter: Payer: Self-pay | Admitting: Physician Assistant

## 2022-12-21 ENCOUNTER — Ambulatory Visit (HOSPITAL_BASED_OUTPATIENT_CLINIC_OR_DEPARTMENT_OTHER): Payer: BC Managed Care – PPO | Admitting: Family Medicine

## 2022-12-21 ENCOUNTER — Ambulatory Visit (INDEPENDENT_AMBULATORY_CARE_PROVIDER_SITE_OTHER): Payer: BC Managed Care – PPO | Admitting: *Deleted

## 2022-12-21 VITALS — BP 141/86 | HR 102 | Temp 98.7°F | Resp 18 | Ht 67.0 in | Wt 144.0 lb

## 2022-12-21 DIAGNOSIS — D500 Iron deficiency anemia secondary to blood loss (chronic): Secondary | ICD-10-CM | POA: Diagnosis not present

## 2022-12-21 DIAGNOSIS — K5520 Angiodysplasia of colon without hemorrhage: Secondary | ICD-10-CM | POA: Diagnosis not present

## 2022-12-21 MED ORDER — ACETAMINOPHEN 325 MG PO TABS
650.0000 mg | ORAL_TABLET | Freq: Once | ORAL | Status: AC
  Administered 2022-12-21: 650 mg via ORAL
  Filled 2022-12-21: qty 2

## 2022-12-21 MED ORDER — SODIUM CHLORIDE 0.9 % IV SOLN
200.0000 mg | Freq: Once | INTRAVENOUS | Status: AC
  Administered 2022-12-21: 200 mg via INTRAVENOUS
  Filled 2022-12-21: qty 10

## 2022-12-21 MED ORDER — DIPHENHYDRAMINE HCL 25 MG PO CAPS
25.0000 mg | ORAL_CAPSULE | Freq: Once | ORAL | Status: AC
  Administered 2022-12-21: 25 mg via ORAL
  Filled 2022-12-21: qty 1

## 2022-12-21 NOTE — Progress Notes (Signed)
Diagnosis: Iron Deficiency Anemia  Provider:  Chilton Greathouse MD  Procedure: Infusion  IV Type: Peripheral, IV Location: L Antecubital  Venofer (Iron Sucrose), Dose: 200 mg  Infusion Start Time: 1121  Infusion Stop Time: 1137  Post Infusion IV Care: Observation period completed and Peripheral IV Discontinued  Discharge: Condition: Good, Destination: Home . AVS Provided  Performed by:  Evelena Peat, RN

## 2022-12-28 ENCOUNTER — Ambulatory Visit (INDEPENDENT_AMBULATORY_CARE_PROVIDER_SITE_OTHER): Payer: BC Managed Care – PPO

## 2022-12-28 VITALS — BP 136/89 | HR 80 | Temp 97.7°F | Resp 18 | Ht 67.0 in | Wt 146.6 lb

## 2022-12-28 DIAGNOSIS — D500 Iron deficiency anemia secondary to blood loss (chronic): Secondary | ICD-10-CM

## 2022-12-28 DIAGNOSIS — D509 Iron deficiency anemia, unspecified: Secondary | ICD-10-CM | POA: Diagnosis not present

## 2022-12-28 MED ORDER — DIPHENHYDRAMINE HCL 25 MG PO CAPS
25.0000 mg | ORAL_CAPSULE | Freq: Once | ORAL | Status: AC
  Administered 2022-12-28: 25 mg via ORAL
  Filled 2022-12-28: qty 1

## 2022-12-28 MED ORDER — SODIUM CHLORIDE 0.9 % IV SOLN
200.0000 mg | Freq: Once | INTRAVENOUS | Status: AC
  Administered 2022-12-28: 200 mg via INTRAVENOUS
  Filled 2022-12-28: qty 10

## 2022-12-28 MED ORDER — ACETAMINOPHEN 325 MG PO TABS
650.0000 mg | ORAL_TABLET | Freq: Once | ORAL | Status: AC
  Administered 2022-12-28: 650 mg via ORAL
  Filled 2022-12-28: qty 2

## 2022-12-28 NOTE — Progress Notes (Signed)
Diagnosis: Iron Deficiency Anemia  Provider:  Chilton Greathouse MD  Procedure: IV Infusion  IV Type: Peripheral, IV Location: L Antecubital  Venofer (Iron Sucrose), Dose: 200 mg  Infusion Start Time: 1120  Infusion Stop Time: 1138 am  Post Infusion IV Care: Observation period completed and Peripheral IV Discontinued  Discharge: Condition: Good, Destination: Home . AVS Declined  Performed by:  Adriana Mccallum, RN

## 2023-01-04 ENCOUNTER — Ambulatory Visit (HOSPITAL_BASED_OUTPATIENT_CLINIC_OR_DEPARTMENT_OTHER): Payer: BC Managed Care – PPO | Admitting: Family Medicine

## 2023-01-04 ENCOUNTER — Ambulatory Visit: Payer: BC Managed Care – PPO

## 2023-01-04 MED ORDER — DIPHENHYDRAMINE HCL 25 MG PO CAPS: 25.0000 mg | ORAL_CAPSULE | Freq: Once | ORAL | Status: AC

## 2023-01-04 MED ORDER — SODIUM CHLORIDE 0.9 % IV SOLN
200.0000 mg | Freq: Once | INTRAVENOUS | Status: AC
  Filled 2023-01-04: qty 10

## 2023-01-04 MED ORDER — ACETAMINOPHEN 325 MG PO TABS: 650.0000 mg | ORAL_TABLET | Freq: Once | ORAL | Status: AC

## 2023-01-09 ENCOUNTER — Encounter: Payer: Self-pay | Admitting: Physician Assistant

## 2023-01-11 ENCOUNTER — Other Ambulatory Visit: Payer: Self-pay | Admitting: Nurse Practitioner

## 2023-01-11 ENCOUNTER — Ambulatory Visit: Payer: BC Managed Care – PPO

## 2023-01-11 DIAGNOSIS — R7989 Other specified abnormal findings of blood chemistry: Secondary | ICD-10-CM

## 2023-01-11 DIAGNOSIS — K760 Fatty (change of) liver, not elsewhere classified: Secondary | ICD-10-CM

## 2023-01-11 MED ORDER — DIPHENHYDRAMINE HCL 25 MG PO CAPS: 25.0000 mg | ORAL_CAPSULE | Freq: Once | ORAL | Status: AC

## 2023-01-11 MED ORDER — SODIUM CHLORIDE 0.9 % IV SOLN
200.0000 mg | Freq: Once | INTRAVENOUS | Status: AC
  Filled 2023-01-11: qty 10

## 2023-01-11 MED ORDER — ACETAMINOPHEN 325 MG PO TABS: 650.0000 mg | ORAL_TABLET | Freq: Once | ORAL | Status: AC

## 2023-01-18 ENCOUNTER — Ambulatory Visit (INDEPENDENT_AMBULATORY_CARE_PROVIDER_SITE_OTHER): Payer: BC Managed Care – PPO | Admitting: *Deleted

## 2023-01-18 VITALS — BP 137/95 | HR 79 | Temp 97.8°F | Resp 18 | Ht 67.0 in | Wt 140.6 lb

## 2023-01-18 DIAGNOSIS — K5520 Angiodysplasia of colon without hemorrhage: Secondary | ICD-10-CM

## 2023-01-18 DIAGNOSIS — D500 Iron deficiency anemia secondary to blood loss (chronic): Secondary | ICD-10-CM | POA: Diagnosis not present

## 2023-01-18 MED ORDER — DIPHENHYDRAMINE HCL 25 MG PO CAPS: 25.0000 mg | ORAL_CAPSULE | Freq: Once | ORAL | Status: DC

## 2023-01-18 MED ORDER — SODIUM CHLORIDE 0.9 % IV SOLN
200.0000 mg | Freq: Once | INTRAVENOUS | Status: AC
  Administered 2023-01-18: 200 mg via INTRAVENOUS
  Filled 2023-01-18: qty 10

## 2023-01-18 MED ORDER — ACETAMINOPHEN 325 MG PO TABS: 650.0000 mg | ORAL_TABLET | Freq: Once | ORAL | Status: DC

## 2023-01-18 NOTE — Progress Notes (Signed)
Diagnosis: Iron Deficiency Anemia  Provider:  Chilton Greathouse MD  Procedure: IV Infusion  IV Type: Peripheral, IV Location: L Forearm  Venofer (Iron Sucrose), Dose: 200 mg  Infusion Start Time: 1042 am  Infusion Stop Time: 1058 am  Post Infusion IV Care: Observation period completed and Peripheral IV Discontinued  Discharge: Condition: Good, Destination: Home . AVS Declined  Performed by:  Forrest Moron, RN

## 2023-02-01 ENCOUNTER — Ambulatory Visit: Payer: BC Managed Care – PPO

## 2023-02-01 MED ORDER — ACETAMINOPHEN 325 MG PO TABS: 650.0000 mg | ORAL_TABLET | Freq: Once | ORAL | Status: AC

## 2023-02-01 MED ORDER — DIPHENHYDRAMINE HCL 25 MG PO CAPS: 25.0000 mg | ORAL_CAPSULE | Freq: Once | ORAL | Status: AC

## 2023-02-01 MED ORDER — SODIUM CHLORIDE 0.9 % IV SOLN
200.0000 mg | Freq: Once | INTRAVENOUS | Status: AC
  Filled 2023-02-01: qty 10

## 2023-02-07 ENCOUNTER — Other Ambulatory Visit: Payer: Self-pay | Admitting: Physician Assistant

## 2023-02-07 DIAGNOSIS — D500 Iron deficiency anemia secondary to blood loss (chronic): Secondary | ICD-10-CM

## 2023-02-08 ENCOUNTER — Ambulatory Visit (INDEPENDENT_AMBULATORY_CARE_PROVIDER_SITE_OTHER): Payer: BC Managed Care – PPO

## 2023-02-08 ENCOUNTER — Inpatient Hospital Stay: Payer: BC Managed Care – PPO | Admitting: Physician Assistant

## 2023-02-08 ENCOUNTER — Inpatient Hospital Stay: Payer: BC Managed Care – PPO | Attending: Oncology

## 2023-02-08 VITALS — BP 141/95 | HR 92 | Temp 98.5°F | Resp 18 | Ht 67.0 in | Wt 140.0 lb

## 2023-02-08 DIAGNOSIS — D509 Iron deficiency anemia, unspecified: Secondary | ICD-10-CM | POA: Diagnosis not present

## 2023-02-08 DIAGNOSIS — D500 Iron deficiency anemia secondary to blood loss (chronic): Secondary | ICD-10-CM

## 2023-02-08 MED ORDER — SODIUM CHLORIDE 0.9 % IV SOLN
200.0000 mg | Freq: Once | INTRAVENOUS | Status: AC
  Administered 2023-02-08: 200 mg via INTRAVENOUS
  Filled 2023-02-08: qty 10

## 2023-02-08 MED ORDER — ACETAMINOPHEN 325 MG PO TABS: 650.0000 mg | ORAL_TABLET | Freq: Once | ORAL | Status: DC

## 2023-02-08 MED ORDER — DIPHENHYDRAMINE HCL 25 MG PO CAPS: 25.0000 mg | ORAL_CAPSULE | Freq: Once | ORAL | Status: DC

## 2023-02-08 NOTE — Progress Notes (Signed)
Diagnosis: Iron Deficiency Anemia  Provider:  Chilton Greathouse MD  Procedure: IV Infusion  IV Type: Peripheral, IV Location: L Forearm  Venofer (Iron Sucrose), Dose: 200 mg  Infusion Start Time: 1059  Infusion Stop Time: 1116  Post Infusion IV Care: Peripheral IV Discontinued  Discharge: Condition: Good, Destination: Home . AVS Declined  Performed by:  Adriana Mccallum, RN

## 2023-02-15 ENCOUNTER — Ambulatory Visit (INDEPENDENT_AMBULATORY_CARE_PROVIDER_SITE_OTHER): Payer: BC Managed Care – PPO

## 2023-02-15 ENCOUNTER — Other Ambulatory Visit: Payer: BC Managed Care – PPO

## 2023-02-15 VITALS — BP 138/87 | HR 88 | Temp 98.6°F | Resp 22 | Ht 67.0 in | Wt 139.4 lb

## 2023-02-15 DIAGNOSIS — D500 Iron deficiency anemia secondary to blood loss (chronic): Secondary | ICD-10-CM | POA: Diagnosis not present

## 2023-02-15 DIAGNOSIS — K5520 Angiodysplasia of colon without hemorrhage: Secondary | ICD-10-CM

## 2023-02-15 MED ORDER — DIPHENHYDRAMINE HCL 25 MG PO CAPS: 25.0000 mg | ORAL_CAPSULE | Freq: Once | ORAL | Status: DC

## 2023-02-15 MED ORDER — ACETAMINOPHEN 325 MG PO TABS: 650.0000 mg | ORAL_TABLET | Freq: Once | ORAL | Status: DC

## 2023-02-15 MED ORDER — SODIUM CHLORIDE 0.9 % IV SOLN
200.0000 mg | Freq: Once | INTRAVENOUS | Status: AC
  Administered 2023-02-15: 200 mg via INTRAVENOUS
  Filled 2023-02-15: qty 10

## 2023-02-15 NOTE — Progress Notes (Signed)
Diagnosis: Iron Deficiency Anemia  Provider:  Chilton Greathouse MD  Procedure: IV Infusion  IV Type: Peripheral, IV Location: L Forearm  Venofer (Iron Sucrose), Dose: 200 mg  Infusion Start Time: 1041  Infusion Stop Time: 1100  Post Infusion IV Care: Patient declined observation and Peripheral IV Discontinued  Discharge: Condition: Stable, Destination: Home . AVS Declined  Performed by:  Wyvonne Lenz, RN

## 2023-03-15 ENCOUNTER — Ambulatory Visit: Admission: RE | Admit: 2023-03-15 | Payer: BC Managed Care – PPO | Source: Ambulatory Visit

## 2023-03-15 DIAGNOSIS — R7989 Other specified abnormal findings of blood chemistry: Secondary | ICD-10-CM

## 2023-03-15 DIAGNOSIS — K760 Fatty (change of) liver, not elsewhere classified: Secondary | ICD-10-CM

## 2023-05-20 ENCOUNTER — Telehealth (HOSPITAL_BASED_OUTPATIENT_CLINIC_OR_DEPARTMENT_OTHER): Payer: Self-pay | Admitting: *Deleted

## 2023-05-20 NOTE — Telephone Encounter (Signed)
LVM to schedule bp check

## 2023-07-25 ENCOUNTER — Encounter (HOSPITAL_BASED_OUTPATIENT_CLINIC_OR_DEPARTMENT_OTHER): Payer: Self-pay | Admitting: Family Medicine

## 2023-07-30 ENCOUNTER — Telehealth (HOSPITAL_BASED_OUTPATIENT_CLINIC_OR_DEPARTMENT_OTHER): Payer: Self-pay | Admitting: *Deleted

## 2023-07-30 NOTE — Telephone Encounter (Signed)
Called pt to schedule follow up and check on BP. Pt stated he has another PCP advised that I would remove de [place] as PCP with verbal understanding

## 2024-05-01 ENCOUNTER — Other Ambulatory Visit: Payer: Self-pay | Admitting: Nurse Practitioner

## 2024-05-01 DIAGNOSIS — R7989 Other specified abnormal findings of blood chemistry: Secondary | ICD-10-CM

## 2024-05-01 DIAGNOSIS — F1090 Alcohol use, unspecified, uncomplicated: Secondary | ICD-10-CM

## 2024-05-01 DIAGNOSIS — K760 Fatty (change of) liver, not elsewhere classified: Secondary | ICD-10-CM

## 2024-05-08 ENCOUNTER — Other Ambulatory Visit

## 2024-05-29 ENCOUNTER — Other Ambulatory Visit

## 2024-06-05 ENCOUNTER — Other Ambulatory Visit

## 2024-06-12 ENCOUNTER — Other Ambulatory Visit

## 2024-06-19 ENCOUNTER — Other Ambulatory Visit
